# Patient Record
Sex: Female | Born: 1945 | Race: White | Hispanic: No | Marital: Married | Smoking: Former smoker
Health system: Southern US, Community
[De-identification: ages and names within clinical notes are randomized; demographics above are authoritative.]

## PROBLEM LIST (undated history)

## (undated) DIAGNOSIS — F329 Major depressive disorder, single episode, unspecified: Secondary | ICD-10-CM

## (undated) DIAGNOSIS — E785 Hyperlipidemia, unspecified: Secondary | ICD-10-CM

## (undated) DIAGNOSIS — F32A Depression, unspecified: Secondary | ICD-10-CM

## (undated) DIAGNOSIS — I10 Essential (primary) hypertension: Secondary | ICD-10-CM

## (undated) DIAGNOSIS — E079 Disorder of thyroid, unspecified: Secondary | ICD-10-CM

## (undated) DIAGNOSIS — M8580 Other specified disorders of bone density and structure, unspecified site: Secondary | ICD-10-CM

## (undated) DIAGNOSIS — I639 Cerebral infarction, unspecified: Secondary | ICD-10-CM

## (undated) DIAGNOSIS — J45909 Unspecified asthma, uncomplicated: Secondary | ICD-10-CM

## (undated) HISTORY — PX: WRIST SURGERY: SHX841

## (undated) HISTORY — PX: APPENDECTOMY: SHX54

## (undated) HISTORY — DX: Disorder of thyroid, unspecified: E07.9

## (undated) HISTORY — DX: Other specified disorders of bone density and structure, unspecified site: M85.80

## (undated) HISTORY — PX: TUBAL LIGATION: SHX77

## (undated) HISTORY — DX: Hyperlipidemia, unspecified: E78.5

## (undated) HISTORY — DX: Essential (primary) hypertension: I10

## (undated) HISTORY — PX: CHOLECYSTECTOMY: SHX55

## (undated) HISTORY — PX: OTHER SURGICAL HISTORY: SHX169

## (undated) HISTORY — DX: Major depressive disorder, single episode, unspecified: F32.9

## (undated) HISTORY — PX: ABDOMINAL HYSTERECTOMY: SHX81

## (undated) HISTORY — DX: Depression, unspecified: F32.A

---

## 2013-04-01 ENCOUNTER — Encounter: Payer: Self-pay | Admitting: Internal Medicine

## 2013-04-01 ENCOUNTER — Ambulatory Visit (INDEPENDENT_AMBULATORY_CARE_PROVIDER_SITE_OTHER): Payer: BC Managed Care – PPO | Admitting: Internal Medicine

## 2013-04-01 VITALS — BP 142/80 | HR 58 | Temp 97.0°F | Resp 20 | Ht 63.0 in | Wt 256.0 lb

## 2013-04-01 DIAGNOSIS — R609 Edema, unspecified: Secondary | ICD-10-CM

## 2013-04-01 DIAGNOSIS — M899 Disorder of bone, unspecified: Secondary | ICD-10-CM

## 2013-04-01 DIAGNOSIS — F329 Major depressive disorder, single episode, unspecified: Secondary | ICD-10-CM

## 2013-04-01 DIAGNOSIS — I10 Essential (primary) hypertension: Secondary | ICD-10-CM

## 2013-04-01 DIAGNOSIS — Z8669 Personal history of other diseases of the nervous system and sense organs: Secondary | ICD-10-CM

## 2013-04-01 DIAGNOSIS — E039 Hypothyroidism, unspecified: Secondary | ICD-10-CM | POA: Insufficient documentation

## 2013-04-01 DIAGNOSIS — E785 Hyperlipidemia, unspecified: Secondary | ICD-10-CM | POA: Insufficient documentation

## 2013-04-01 DIAGNOSIS — Z780 Asymptomatic menopausal state: Secondary | ICD-10-CM

## 2013-04-01 DIAGNOSIS — IMO0002 Reserved for concepts with insufficient information to code with codable children: Secondary | ICD-10-CM

## 2013-04-01 DIAGNOSIS — Z9079 Acquired absence of other genital organ(s): Secondary | ICD-10-CM | POA: Insufficient documentation

## 2013-04-01 DIAGNOSIS — M8580 Other specified disorders of bone density and structure, unspecified site: Secondary | ICD-10-CM

## 2013-04-01 DIAGNOSIS — R600 Localized edema: Secondary | ICD-10-CM

## 2013-04-01 DIAGNOSIS — Z87898 Personal history of other specified conditions: Secondary | ICD-10-CM

## 2013-04-01 DIAGNOSIS — Z90710 Acquired absence of both cervix and uterus: Secondary | ICD-10-CM

## 2013-04-01 DIAGNOSIS — F32A Depression, unspecified: Secondary | ICD-10-CM | POA: Insufficient documentation

## 2013-04-01 DIAGNOSIS — N951 Menopausal and female climacteric states: Secondary | ICD-10-CM

## 2013-04-01 MED ORDER — LISINOPRIL-HYDROCHLOROTHIAZIDE 20-25 MG PO TABS: 1.0000 | ORAL_TABLET | Freq: Every day | ORAL | Status: DC

## 2013-04-01 NOTE — Progress Notes (Signed)
Subjective:    Patient ID: Veronica Simpson, female    DOB: 08-28-1945, 67 y.o.   MRN: [id number]  HPI [name] is a new pt here for first visit.    PMH Of htn, hyperlipidemia,  Depression(prozac over 10 years), osteopenia,  OSA, hypothyroidism with goiter,  .  She is S/P  HystBSO.  She is concerned over 2 issues.  She has LE edema She tells me that she was started on Norvasc for her BP approx 6 months ago and has noted bilateral leg swelling since then.  No pain in calf  She also has painful intercourse.  Describes pain with penetration.  No vaginal discharge no external rash reported.  She has tried various OTC lubricants but still painful.  She does states she has vaginal dryess    No Known Allergies Past Medical History  Diagnosis Date  . Thyroid disease   . Hyperlipidemia   . Hypertension   . Depression   . Osteopenia    Past Surgical History  Procedure Laterality Date  . Cholecystectomy    . Abdominal hysterectomy    . Wrist surgery Left   . Tubal ligation    . Gstric bypass     History   Social History  . Marital Status: Married    Spouse Name: N/A    Number of Children: N/A  . Years of Education: N/A   Occupational History  . Not on file.   Social History Main Topics  . Smoking status: Former Smoker    Quit date: 04/01/1977  . Smokeless tobacco: Never Used  . Alcohol Use: No  . Drug Use: No  . Sexual Activity: Yes    Birth Control/ Protection: Post-menopausal   Other Topics Concern  . Not on file   Social History Narrative  . No narrative on file   Family History  Problem Relation Age of Onset  . Depression Mother   . Arthritis Mother   . Dementia Mother   . Hyperlipidemia Mother   . Hypertension Mother   . Cancer Father     lymphoma  . Depression Father   . Diabetes Father   . Arthritis Father   . Cancer Sister     stomach  . Cancer Brother     prostate  . Cancer Maternal Grandmother     stomach  . Heart disease Maternal Grandfather    . Cancer Paternal Grandmother     multiple myloma  . Heart disease Paternal Grandfather    Patient Active Problem List   Diagnosis Date Noted  . HTN (hypertension) 04/01/2013  . Hyperlipidemia 04/01/2013  . Hypothyroidism 04/01/2013  . Dyspareunia 04/01/2013  . Depression 04/01/2013  . S/P total hysterectomy and bilateral salpingo-oophorectomy 04/01/2013  . Menopause 04/01/2013  . History of sleep apnea 04/01/2013  . Osteopenia    No current outpatient prescriptions on file prior to visit.   No current facility-administered medications on file prior to visit.       Review of Systems See HPI    Objective:   Physical Exam Physical Exam  Nursing note and vitals reviewed.  Constitutional: She is oriented to person, place, and time. She appears well-developed and well-nourished.  HENT:  Head: Normocephalic and atraumatic.  Cardiovascular: Normal rate and regular rhythm. Exam reveals no gallop and no friction rub.  No murmur heard.  Pulmonary/Chest: Breath sounds normal. She has no wheezes. She has no rales.  Neurological: She is alert and oriented to person, place, and time.  Skin:  Skin is warm and dry.  Ext:  2+ edema bilaterally to both knees Psychiatric: She has a normal mood and affect. Her behavior is normal.          Assessment & Plan:  HTN  Will stop norvasc and change lisinopril to lisinopril/HCTZ 20/25  Check labs today  Edema  norvasc likely contributing  See above low salt diet  Hyperlipidemia  Check today  Dysparuenia  Will do pelvic next visit.  Need mm results before considering vaginal estrogen  She is S/P  Hyst/BSO  Hypthyroidism she reports goiter in past  Continue meds  Check TSH will need ultrasound at some point  Depression continue prozac  OSA  She tell me she is not using her CPAP .    Had seen [facility] in the past

## 2013-04-01 NOTE — Patient Instructions (Addendum)
Stop the lisinopril 40 mg  Take lisinopril /HCTZ  20/25  One daily  Labs today  Go to [email or web address] website  See me 7-10 days

## 2013-04-02 LAB — CBC WITH DIFFERENTIAL/PLATELET
Basophils Absolute: 0.1 10*3/uL (ref 0.0–0.1)
Basophils Relative: 1 % (ref 0–1)
Eosinophils Relative: 4 % (ref 0–5)
HCT: 43.3 % (ref 36.0–46.0)
Lymphocytes Relative: 23 % (ref 12–46)
MCHC: 33.9 g/dL (ref 30.0–36.0)
Monocytes Absolute: 0.8 10*3/uL (ref 0.1–1.0)
Neutro Abs: 5.2 10*3/uL (ref 1.7–7.7)
Platelets: 303 10*3/uL (ref 150–400)
RDW: 13.8 % (ref 11.5–15.5)
WBC: 8.4 10*3/uL (ref 4.0–10.5)

## 2013-04-02 LAB — COMPREHENSIVE METABOLIC PANEL
ALT: 29 U/L (ref 0–35)
AST: 31 U/L (ref 0–37)
Alkaline Phosphatase: 67 U/L (ref 39–117)
BUN: 10 mg/dL (ref 6–23)
Creat: 0.64 mg/dL (ref 0.50–1.10)
Potassium: 3.7 mEq/L (ref 3.5–5.3)

## 2013-04-02 LAB — LIPID PANEL
HDL: 51 mg/dL (ref >39)
LDL Cholesterol: 42 mg/dL (ref 0–99)
Total CHOL/HDL Ratio: 2.3 Ratio

## 2013-04-02 LAB — VITAMIN D 25 HYDROXY (VIT D DEFICIENCY, FRACTURES): Vit D, 25-Hydroxy: 46 ng/mL (ref 30–89)

## 2013-04-02 LAB — TSH: TSH: 0.648 u[IU]/mL (ref 0.350–4.500)

## 2013-04-07 ENCOUNTER — Telehealth: Payer: Self-pay | Admitting: Internal Medicine

## 2013-04-07 NOTE — Telephone Encounter (Signed)
Left message for pt to clal office regarding lab results

## 2013-04-08 ENCOUNTER — Other Ambulatory Visit: Payer: Self-pay | Admitting: *Deleted

## 2013-04-08 MED ORDER — FLUOXETINE HCL 40 MG PO CAPS: 40.0000 mg | ORAL_CAPSULE | Freq: Every day | ORAL | Status: DC

## 2013-04-08 NOTE — Telephone Encounter (Signed)
Pt request 90 days also  Returned your call from 04/07/13

## 2013-04-12 ENCOUNTER — Encounter: Payer: Self-pay | Admitting: Internal Medicine

## 2013-04-12 ENCOUNTER — Ambulatory Visit (INDEPENDENT_AMBULATORY_CARE_PROVIDER_SITE_OTHER): Payer: BC Managed Care – PPO | Admitting: Internal Medicine

## 2013-04-12 ENCOUNTER — Encounter: Payer: Self-pay | Admitting: *Deleted

## 2013-04-12 ENCOUNTER — Telehealth: Payer: Self-pay | Admitting: *Deleted

## 2013-04-12 VITALS — BP 138/70 | HR 51 | Temp 97.5°F | Resp 18 | Wt 254.0 lb

## 2013-04-12 DIAGNOSIS — N9489 Other specified conditions associated with female genital organs and menstrual cycle: Secondary | ICD-10-CM

## 2013-04-12 DIAGNOSIS — I10 Essential (primary) hypertension: Secondary | ICD-10-CM

## 2013-04-12 DIAGNOSIS — R3915 Urgency of urination: Secondary | ICD-10-CM

## 2013-04-12 DIAGNOSIS — N898 Other specified noninflammatory disorders of vagina: Secondary | ICD-10-CM | POA: Insufficient documentation

## 2013-04-12 MED ORDER — ESTRADIOL 10 MCG VA TABS: ORAL_TABLET | VAGINAL | Status: DC

## 2013-04-12 NOTE — Addendum Note (Signed)
Addended by: Raechel Chute D on: 04/12/2013 12:08 PM   Modules accepted: Orders

## 2013-04-12 NOTE — Progress Notes (Signed)
Subjective:    Patient ID: Veronica Simpson, female    DOB: [birth year], 67 y.o.   MRN: [id number]  HPI Veronica Simpson is here for follow up of elevated bilirubin.  She reports she has a long history of elevarted bilirubin that has been evalutated by Dr. Loman Chroman.  She has had a past liver biopsy and was told it was "fine"  She has never been jaundiced.  Social ETOH  She is off her norvasc now.   No LE edema.  BP at home is in the "130's"   She has lost 2 lbs on her scale at home.    She continues to have vaginal dryness -  Her mm result is pending.    She does have urinary urgency  No dysuria  No Known Allergies Past Medical History  Diagnosis Date  . Thyroid disease   . Hyperlipidemia   . Hypertension   . Depression   . Osteopenia    Past Surgical History  Procedure Laterality Date  . Cholecystectomy    . Abdominal hysterectomy    . Wrist surgery Left   . Tubal ligation    . Gstric bypass     History   Social History  . Marital Status: Married    Spouse Name: N/A    Number of Children: N/A  . Years of Education: N/A   Occupational History  . Not on file.   Social History Main Topics  . Smoking status: Former Smoker    Quit date: 04/01/1977  . Smokeless tobacco: Never Used  . Alcohol Use: No  . Drug Use: No  . Sexual Activity: Yes    Birth Control/ Protection: Post-menopausal   Other Topics Concern  . Not on file   Social History Narrative  . No narrative on file   Family History  Problem Relation Age of Onset  . Depression Mother   . Arthritis Mother   . Dementia Mother   . Hyperlipidemia Mother   . Hypertension Mother   . Cancer Father     lymphoma  . Depression Father   . Diabetes Father   . Arthritis Father   . Cancer Sister     stomach  . Cancer Brother     prostate  . Cancer Maternal Grandmother     stomach  . Heart disease Maternal Grandfather   . Cancer Paternal Grandmother     multiple myloma  . Heart disease Paternal Grandfather     Patient Active Problem List   Diagnosis Date Noted  . Urinary urgency 04/12/2013  . Vaginal dryness 04/12/2013  . HTN (hypertension) 04/01/2013  . Hyperlipidemia 04/01/2013  . Hypothyroidism 04/01/2013  . Dyspareunia 04/01/2013  . Depression 04/01/2013  . S/P total hysterectomy and bilateral salpingo-oophorectomy 04/01/2013  . Menopause 04/01/2013  . History of sleep apnea 04/01/2013  . Edema, lower extremity 04/01/2013  . Osteopenia    Current Outpatient Prescriptions on File Prior to Visit  Medication Sig Dispense Refill  . aspirin 325 MG tablet Take 325 mg by mouth daily.      Marland Kitchen atenolol (TENORMIN) 50 MG tablet Take 50 mg by mouth daily.      Marland Kitchen atorvastatin (LIPITOR) 40 MG tablet Take 40 mg by mouth daily.      . Cholecalciferol (VITAMIN D-3) 1000 UNITS CAPS Take 1 capsule by mouth daily.      Marland Kitchen co-enzyme Q-10 30 MG capsule Take 30 mg by mouth daily.      Marland Kitchen FLUoxetine (PROZAC) 40 MG capsule  Take 1 capsule (40 mg total) by mouth daily.  90 capsule  1  . ibandronate (BONIVA) 150 MG tablet Take 150 mg by mouth every 30 (thirty) days. Take in the morning with a full glass of water, on an empty stomach, and do not take anything else by mouth or lie down for the next 30 min.      Marland Kitchen levothyroxine (SYNTHROID, LEVOTHROID) 88 MCG tablet Take 88 mcg by mouth daily before breakfast.      . lisinopril-hydrochlorothiazide (PRINZIDE,ZESTORETIC) 20-25 MG per tablet Take 1 tablet by mouth daily.  90 tablet  3  . Misc Natural Products (OSTEO BI-FLEX ADV TRIPLE ST PO) Take 2 capsules by mouth daily.      . Multiple Vitamins-Minerals (MULTIVITAMIN WITH MINERALS) tablet Take 1 tablet by mouth daily.      Marland Kitchen amLODipine (NORVASC) 5 MG tablet Take 5 mg by mouth daily.      . vitamin C (ASCORBIC ACID) 500 MG tablet Take 500 mg by mouth daily.       No current facility-administered medications on file prior to visit.       Review of Systems See HPI    Objective:   Physical Exam Physical Exam   Nursing note and vitals reviewed.  Constitutional: She is oriented to person, place, and time. She appears well-developed and well-nourished.  HENT:  Head: Normocephalic and atraumatic.  Cardiovascular: Normal rate and regular rhythm. Exam reveals no gallop and no friction rub.  No murmur heard.  Pulmonary/Chest: Breath sounds normal. She has no wheezes. She has no rales.  Abd  No CVA tenderness Neurological: She is alert and oriented to person, place, and time.  Skin: Skin is warm and dry.  Psychiatric: She has a normal mood and affect. Her behavior is normal.             Assessment & Plan:  HTN  Off norvasc now  BP in acceptable range  Le Edema  Resolved off norvasc  Urinary urgency    U/A today neg  Vaginal dryness/dyspareunia  Once I know mm normal can start vagifem  Nightly for 2 wweeks then 2 times per week   Schedule for pelvic exam

## 2013-04-12 NOTE — Telephone Encounter (Signed)
Notified pt that she can begin taking the vagifem

## 2013-04-12 NOTE — Telephone Encounter (Signed)
Message copied by Mathews Robinsons on Mon Apr 12, 2013  8:55 AM ------      Message from: Raechel Chute D      Created: Mon Apr 12, 2013  8:21 AM       Karen Kitchens            Call pt and let her know that her bilirubin is high.  I need to see her in office about this.  Give her an appt             Route back with the date ------

## 2013-04-12 NOTE — Patient Instructions (Addendum)
Use vagifem  10 mcg nightly for 2 weeks then cut back to twice a week  Can use Luvena with intercouse  See me for pelvic exam

## 2013-04-12 NOTE — Telephone Encounter (Signed)
Left message fofr pt to return call

## 2013-04-15 ENCOUNTER — Telehealth: Payer: Self-pay | Admitting: *Deleted

## 2013-04-20 NOTE — Telephone Encounter (Signed)
error 

## 2013-04-26 ENCOUNTER — Ambulatory Visit (INDEPENDENT_AMBULATORY_CARE_PROVIDER_SITE_OTHER): Payer: BC Managed Care – PPO | Admitting: Internal Medicine

## 2013-04-26 ENCOUNTER — Encounter: Payer: Self-pay | Admitting: Internal Medicine

## 2013-04-26 VITALS — BP 144/78 | HR 54 | Temp 97.2°F | Resp 18 | Wt 257.0 lb

## 2013-04-26 DIAGNOSIS — N898 Other specified noninflammatory disorders of vagina: Secondary | ICD-10-CM

## 2013-04-26 DIAGNOSIS — R002 Palpitations: Secondary | ICD-10-CM

## 2013-04-26 DIAGNOSIS — I498 Other specified cardiac arrhythmias: Secondary | ICD-10-CM

## 2013-04-26 DIAGNOSIS — IMO0002 Reserved for concepts with insufficient information to code with codable children: Secondary | ICD-10-CM

## 2013-04-26 DIAGNOSIS — Z1151 Encounter for screening for human papillomavirus (HPV): Secondary | ICD-10-CM

## 2013-04-26 DIAGNOSIS — Z1272 Encounter for screening for malignant neoplasm of vagina: Secondary | ICD-10-CM

## 2013-04-26 DIAGNOSIS — N9489 Other specified conditions associated with female genital organs and menstrual cycle: Secondary | ICD-10-CM

## 2013-04-26 MED ORDER — ESTROGENS, CONJUGATED 0.625 MG/GM VA CREA: TOPICAL_CREAM | VAGINAL | Status: DC

## 2013-04-26 NOTE — Progress Notes (Signed)
Subjective:    Patient ID: Veronica Simpson, female    DOB: 1945/12/03, 67 y.o.   MRN: [id number]  HPI Veronica Simpson  Is here for pelvic.  She is S/P hysterectomy and BSO  She report she could not affored the vagifem as she had a $200.00 co-pay  She was feeling anxious as if her heart was beating rapidly  No chest pain no radiation.  She has had similar episodes in the past  No Known Allergies Past Medical History  Diagnosis Date  . Thyroid disease   . Hyperlipidemia   . Hypertension   . Depression   . Osteopenia    Past Surgical History  Procedure Laterality Date  . Cholecystectomy    . Abdominal hysterectomy    . Wrist surgery Left   . Tubal ligation    . Gstric bypass     History   Social History  . Marital Status: Married    Spouse Name: N/A    Number of Children: N/A  . Years of Education: N/A   Occupational History  . Not on file.   Social History Main Topics  . Smoking status: Former Smoker    Quit date: 04/01/1977  . Smokeless tobacco: Never Used  . Alcohol Use: No  . Drug Use: No  . Sexual Activity: Yes    Birth Control/ Protection: Post-menopausal   Other Topics Concern  . Not on file   Social History Narrative  . No narrative on file   Family History  Problem Relation Age of Onset  . Depression Mother   . Arthritis Mother   . Dementia Mother   . Hyperlipidemia Mother   . Hypertension Mother   . Cancer Father     lymphoma  . Depression Father   . Diabetes Father   . Arthritis Father   . Cancer Sister     stomach  . Cancer Brother     prostate  . Cancer Maternal Grandmother     stomach  . Heart disease Maternal Grandfather   . Cancer Paternal Grandmother     multiple myloma  . Heart disease Paternal Grandfather    Patient Active Problem List   Diagnosis Date Noted  . Urinary urgency 04/12/2013  . Vaginal dryness 04/12/2013  . HTN (hypertension) 04/01/2013  . Hyperlipidemia 04/01/2013  . Hypothyroidism 04/01/2013  . Dyspareunia  04/01/2013  . Depression 04/01/2013  . S/P total hysterectomy and bilateral salpingo-oophorectomy 04/01/2013  . Menopause 04/01/2013  . History of sleep apnea 04/01/2013  . Edema, lower extremity 04/01/2013  . Osteopenia    Current Outpatient Prescriptions on File Prior to Visit  Medication Sig Dispense Refill  . amLODipine (NORVASC) 5 MG tablet Take 5 mg by mouth daily.      Marland Kitchen aspirin 325 MG tablet Take 325 mg by mouth daily.      Marland Kitchen atenolol (TENORMIN) 50 MG tablet Take 50 mg by mouth daily.      Marland Kitchen atorvastatin (LIPITOR) 40 MG tablet Take 40 mg by mouth daily.      . Cholecalciferol (VITAMIN D-3) 1000 UNITS CAPS Take 1 capsule by mouth daily.      Marland Kitchen co-enzyme Q-10 30 MG capsule Take 30 mg by mouth daily.      Marland Kitchen FLUoxetine (PROZAC) 40 MG capsule Take 1 capsule (40 mg total) by mouth daily.  90 capsule  1  . ibandronate (BONIVA) 150 MG tablet Take 150 mg by mouth every 30 (thirty) days. Take in the morning with a full  glass of water, on an empty stomach, and do not take anything else by mouth or lie down for the next 30 min.      Marland Kitchen levothyroxine (SYNTHROID, LEVOTHROID) 88 MCG tablet Take 88 mcg by mouth daily before breakfast.      . lisinopril-hydrochlorothiazide (PRINZIDE,ZESTORETIC) 20-25 MG per tablet Take 1 tablet by mouth daily.  90 tablet  3  . Misc Natural Products (OSTEO BI-FLEX ADV TRIPLE ST PO) Take 2 capsules by mouth daily.      . Multiple Vitamins-Minerals (MULTIVITAMIN WITH MINERALS) tablet Take 1 tablet by mouth daily.      . vitamin C (ASCORBIC ACID) 500 MG tablet Take 500 mg by mouth daily.       No current facility-administered medications on file prior to visit.       Review of Systems    see HPI  Objective:   Physical Exam Physical Exam  Nursing note and vitals reviewed.  Constitutional: She is oriented to person, place, and time. She appears well-developed and well-nourished.  HENT:  Head: Normocephalic and atraumatic.  Cardiovascular: Normal rate and  regular rhythm. Exam reveals no gallop and no friction rub.  No murmur heard.  Pulmonary/Chest: Breath sounds normal. She has no wheezes. She has no rales.  Neurological: She is alert and oriented to person, place, and time.  Pelvic  Thin pale labia,  Fusion of labia minora Vagina no lesion Pap obtained vaginal cuff No adnexal masses or tenderness Skin: Skin is warm and dry.  Psychiatric: She has a normal mood and affect. Her behavior is normal.             Assessment & Plan:  Dyspareunia/vaginal dryness  Will give Premarin 0.5 gm nightly two weeks then twice a week  Palpitations  EKG shows sinus bradycardia  No acute changes Likely du to anxiety  Schedule CPE with me

## 2013-04-26 NOTE — Addendum Note (Signed)
Addended by: Mathews Robinsons on: 04/26/2013 01:21 PM   Modules accepted: Orders

## 2013-04-26 NOTE — Patient Instructions (Addendum)
Schedule CPE with me    Call if needed

## 2013-04-29 ENCOUNTER — Encounter: Payer: Self-pay | Admitting: *Deleted

## 2013-05-09 ENCOUNTER — Encounter: Payer: Self-pay | Admitting: Internal Medicine

## 2013-05-09 DIAGNOSIS — M810 Age-related osteoporosis without current pathological fracture: Secondary | ICD-10-CM | POA: Insufficient documentation

## 2013-05-09 DIAGNOSIS — Z8673 Personal history of transient ischemic attack (TIA), and cerebral infarction without residual deficits: Secondary | ICD-10-CM | POA: Insufficient documentation

## 2013-06-03 ENCOUNTER — Other Ambulatory Visit: Payer: Self-pay

## 2013-06-14 ENCOUNTER — Telehealth: Payer: Self-pay | Admitting: *Deleted

## 2013-06-14 ENCOUNTER — Other Ambulatory Visit: Payer: Self-pay | Admitting: *Deleted

## 2013-06-14 NOTE — Telephone Encounter (Signed)
Refill request

## 2013-06-14 NOTE — Telephone Encounter (Signed)
Veronica Simpson needs a new Rx of atorvastatin (LIPITOR) 40 MG tablet called into the pharmacy.

## 2013-06-15 MED ORDER — ATORVASTATIN CALCIUM 40 MG PO TABS: 40.0000 mg | ORAL_TABLET | Freq: Every day | ORAL | Status: DC

## 2013-07-16 ENCOUNTER — Other Ambulatory Visit: Payer: Self-pay | Admitting: *Deleted

## 2013-07-16 ENCOUNTER — Encounter: Payer: Self-pay | Admitting: Internal Medicine

## 2013-07-16 NOTE — Telephone Encounter (Signed)
Refill request CPE in Feb.

## 2013-07-17 MED ORDER — ATENOLOL 50 MG PO TABS: 50.0000 mg | ORAL_TABLET | Freq: Every day | ORAL | Status: DC

## 2013-07-26 ENCOUNTER — Encounter: Payer: Self-pay | Admitting: Internal Medicine

## 2013-07-26 ENCOUNTER — Other Ambulatory Visit: Payer: Self-pay | Admitting: *Deleted

## 2013-07-26 MED ORDER — AMLODIPINE BESYLATE 5 MG PO TABS: 5.0000 mg | ORAL_TABLET | Freq: Every day | ORAL | Status: DC

## 2013-07-26 NOTE — Telephone Encounter (Signed)
Refill request

## 2013-09-08 ENCOUNTER — Encounter: Payer: Self-pay | Admitting: Internal Medicine

## 2013-09-08 ENCOUNTER — Ambulatory Visit (INDEPENDENT_AMBULATORY_CARE_PROVIDER_SITE_OTHER): Payer: BC Managed Care – PPO | Admitting: Internal Medicine

## 2013-09-08 VITALS — BP 145/67 | HR 59 | Temp 98.1°F | Resp 18 | Wt 265.0 lb

## 2013-09-08 DIAGNOSIS — E785 Hyperlipidemia, unspecified: Secondary | ICD-10-CM

## 2013-09-08 DIAGNOSIS — I498 Other specified cardiac arrhythmias: Secondary | ICD-10-CM

## 2013-09-08 DIAGNOSIS — Z9884 Bariatric surgery status: Secondary | ICD-10-CM

## 2013-09-08 DIAGNOSIS — N952 Postmenopausal atrophic vaginitis: Secondary | ICD-10-CM

## 2013-09-08 DIAGNOSIS — R001 Bradycardia, unspecified: Secondary | ICD-10-CM

## 2013-09-08 DIAGNOSIS — E039 Hypothyroidism, unspecified: Secondary | ICD-10-CM

## 2013-09-08 DIAGNOSIS — I10 Essential (primary) hypertension: Secondary | ICD-10-CM

## 2013-09-08 DIAGNOSIS — Z23 Encounter for immunization: Secondary | ICD-10-CM

## 2013-09-08 DIAGNOSIS — Z0000 Encounter for general adult medical examination without abnormal findings: Secondary | ICD-10-CM

## 2013-09-08 LAB — POCT URINALYSIS DIPSTICK
Bilirubin, UA: NEGATIVE
Blood, UA: NEGATIVE
Glucose, UA: NEGATIVE
Ketones, UA: NEGATIVE
Leukocytes, UA: NEGATIVE
NITRITE UA: NEGATIVE
PROTEIN UA: NEGATIVE
SPEC GRAV UA: 1.02
UROBILINOGEN UA: NEGATIVE
pH, UA: 6

## 2013-09-08 LAB — HEMOCCULT GUIAC POC 1CARD (OFFICE): FECAL OCCULT BLD: NEGATIVE

## 2013-09-08 NOTE — Progress Notes (Signed)
Subjective:    Patient ID: Veronica Simpson, female    DOB: [birth year], 68 y.o.   MRN: [id number]  HPI  Veronica Simpson is here for CPE  She could not afford estrogen creme for her atrophic vaginitis.  She is OK with this  Tolerating meds fine.    Mother with advancing dementia and this will stress patient  No Known Allergies Past Medical History  Diagnosis Date  . Thyroid disease   . Hyperlipidemia   . Hypertension   . Depression   . Osteopenia    Past Surgical History  Procedure Laterality Date  . Cholecystectomy    . Abdominal hysterectomy    . Wrist surgery Left   . Tubal ligation    . Gstric bypass     History   Social History  . Marital Status: Married    Spouse Name: N/A    Number of Children: N/A  . Years of Education: N/A   Occupational History  . Not on file.   Social History Main Topics  . Smoking status: Former Smoker    Quit date: 04/01/1977  . Smokeless tobacco: Never Used  . Alcohol Use: No  . Drug Use: No  . Sexual Activity: Yes    Birth Control/ Protection: Post-menopausal   Other Topics Concern  . Not on file   Social History Narrative  . No narrative on file   Family History  Problem Relation Age of Onset  . Depression Mother   . Arthritis Mother   . Dementia Mother   . Hyperlipidemia Mother   . Hypertension Mother   . Cancer Father     lymphoma  . Depression Father   . Diabetes Father   . Arthritis Father   . Cancer Sister     stomach  . Cancer Brother     prostate  . Cancer Maternal Grandmother     stomach  . Heart disease Maternal Grandfather   . Cancer Paternal Grandmother     multiple myloma  . Heart disease Paternal Grandfather    Patient Active Problem List   Diagnosis Date Noted  . Sinus bradycardia by electrocardiogram 09/08/2013  . Atrophic vaginitis 09/08/2013  . History of gastric bypass 05/09/2013  . 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 Osteoporosis 05/09/2013  . History of TIA (transient ischemic attack)  05/09/2013  . Urinary urgency 04/12/2013  . Vaginal dryness 04/12/2013  . HTN (hypertension) 04/01/2013  . Hyperlipidemia 04/01/2013  . Hypothyroidism 04/01/2013  . Dyspareunia 04/01/2013  . Depression 04/01/2013  . S/P total hysterectomy and bilateral salpingo-oophorectomy 04/01/2013  . Menopause 04/01/2013  . History of sleep apnea 04/01/2013  . Edema, lower extremity 04/01/2013  . Osteopenia    Current Outpatient Prescriptions on File Prior to Visit  Medication Sig Dispense Refill  . amLODipine (NORVASC) 5 MG tablet Take 1 tablet (5 mg total) by mouth daily.  90 tablet  1  . aspirin 325 MG tablet Take 325 mg by mouth daily.      Marland Kitchen atenolol (TENORMIN) 50 MG tablet Take 1 tablet (50 mg total) by mouth daily.  90 tablet  2  . atorvastatin (LIPITOR) 40 MG tablet Take 1 tablet (40 mg total) by mouth daily.  90 tablet  0  . Cholecalciferol (VITAMIN D-3) 1000 UNITS CAPS Take 1 capsule by mouth daily.      Marland Kitchen co-enzyme Q-10 30 MG capsule Take 30 mg by mouth daily.      Marland Kitchen FLUoxetine (PROZAC) 40 MG capsule Take 1 capsule (  40 mg total) by mouth daily.  90 capsule  1  . ibandronate (BONIVA) 150 MG tablet Take 150 mg by mouth every 30 (thirty) days. Take in the morning with a full glass of water, on an empty stomach, and do not take anything else by mouth or lie down for the next 30 min.      Marland Kitchen levothyroxine (SYNTHROID, LEVOTHROID) 88 MCG tablet Take 88 mcg by mouth daily before breakfast.      . lisinopril-hydrochlorothiazide (PRINZIDE,ZESTORETIC) 20-25 MG per tablet Take 1 tablet by mouth daily.  90 tablet  3  . Misc Natural Products (OSTEO BI-FLEX ADV TRIPLE ST PO) Take 2 capsules by mouth daily.      . Multiple Vitamins-Minerals (MULTIVITAMIN WITH MINERALS) tablet Take 1 tablet by mouth daily.      . vitamin C (ASCORBIC ACID) 500 MG tablet Take 500 mg by mouth daily.       No current facility-administered medications on file prior to visit.      Review of Systems See HPI    Objective:     Physical Exam  Physical Exam  Nursing note and vitals reviewed.  Constitutional: She is oriented to person, place, and time. She appears well-developed and well-nourished.  HENT:  Head: Normocephalic and atraumatic.  Right Ear: Tympanic membrane and ear canal normal. No drainage. Tympanic membrane is not injected and not erythematous.  Left Ear: Tympanic membrane and ear canal normal. No drainage. Tympanic membrane is not injected and not erythematous.  Nose: Nose normal. Right sinus exhibits no maxillary sinus tenderness and no frontal sinus tenderness. Left sinus exhibits no maxillary sinus tenderness and no frontal sinus tenderness.  Mouth/Throat: Oropharynx is clear and moist. No oral lesions. No oropharyngeal exudate.  Eyes: Conjunctivae and EOM are normal. Pupils are equal, round, and reactive to light.  Neck: Normal range of motion. Neck supple. No JVD present. Carotid bruit is not present. No mass and no thyromegaly present.  Cardiovascular: Normal rate, regular rhythm, S1 normal, S2 normal and intact distal pulses. Exam reveals no gallop and no friction rub.  No murmur heard.  Pulses:  Carotid pulses are 2+ on the right side, and 2+ on the left side.  Dorsalis pedis pulses are 2+ on the right side, and 2+ on the left side.  No carotid bruit. No LE edema  Pulmonary/Chest: Breath sounds normal. She has no wheezes. She has no rales. She exhibits no tenderness.  Breasts no discrete mass no nipple discharge no axillary adenopathy   Abdominal: Soft. Bowel sounds are normal. She exhibits no distension and no mass. There is no hepatosplenomegaly. There is no tenderness. There is no CVA tenderness.  Rectal guaiac neg. No mass Musculoskeletal: Normal range of motion.  No active synovitis to joints.  Lymphadenopathy:  She has no cervical adenopathy.  She has no axillary adenopathy.  Right: No inguinal and no supraclavicular adenopathy present.  Left: No inguinal and no supraclavicular  adenopathy present.  Neurological: She is alert and oriented to person, place, and time. She has normal strength and normal reflexes. She displays no tremor. No cranial nerve deficit or sensory deficit. Coordination and gait normal.  Skin: Skin is warm and dry. No rash noted. No cyanosis. Nails show no clubbing.  Psychiatric: She has a normal mood and affect. Her speech is normal and behavior is normal. Cognition and memory are normal.       Assessment & Plan:  Health Maintenance:  Pneumovax today.  Guaiac neg.  UTD with mm.  Colonoscopy due in 2019.  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

## 2013-09-08 NOTE — Patient Instructions (Signed)
See me in 6 months

## 2013-09-09 ENCOUNTER — Other Ambulatory Visit: Payer: Self-pay | Admitting: Internal Medicine

## 2013-09-10 ENCOUNTER — Encounter: Payer: Self-pay | Admitting: Internal Medicine

## 2013-09-27 ENCOUNTER — Other Ambulatory Visit: Payer: Self-pay | Admitting: *Deleted

## 2013-09-27 MED ORDER — FLUOXETINE HCL 40 MG PO CAPS: 40.0000 mg | ORAL_CAPSULE | Freq: Every day | ORAL | Status: DC

## 2013-09-27 NOTE — Telephone Encounter (Signed)
Refill request

## 2013-09-29 ENCOUNTER — Encounter: Payer: Self-pay | Admitting: Internal Medicine

## 2013-10-04 ENCOUNTER — Other Ambulatory Visit: Payer: Self-pay | Admitting: *Deleted

## 2013-10-04 MED ORDER — FLUOXETINE HCL 40 MG PO CAPS: 40.0000 mg | ORAL_CAPSULE | Freq: Every day | ORAL | Status: DC

## 2013-10-04 NOTE — Telephone Encounter (Signed)
Refill request

## 2013-10-06 ENCOUNTER — Encounter: Payer: Self-pay | Admitting: Internal Medicine

## 2013-10-20 ENCOUNTER — Telehealth: Payer: Self-pay | Admitting: *Deleted

## 2013-10-20 NOTE — Telephone Encounter (Signed)
Veronica Simpson needs refill on ibandronate (BONIVA)  [facility]

## 2013-10-21 ENCOUNTER — Other Ambulatory Visit: Payer: Self-pay | Admitting: *Deleted

## 2013-10-21 NOTE — Telephone Encounter (Signed)
Refill request

## 2013-10-22 ENCOUNTER — Other Ambulatory Visit: Payer: Self-pay | Admitting: *Deleted

## 2013-10-22 NOTE — Telephone Encounter (Signed)
Refill request

## 2013-10-23 MED ORDER — IBANDRONATE SODIUM 150 MG PO TABS: 150.0000 mg | ORAL_TABLET | ORAL | Status: DC

## 2013-10-25 ENCOUNTER — Telehealth: Payer: Self-pay | Admitting: *Deleted

## 2013-10-25 NOTE — Telephone Encounter (Signed)
Boniva called in to [facility]

## 2013-11-04 ENCOUNTER — Other Ambulatory Visit: Payer: Self-pay | Admitting: *Deleted

## 2013-11-04 NOTE — Telephone Encounter (Signed)
Refill request

## 2013-11-05 MED ORDER — LEVOTHYROXINE SODIUM 88 MCG PO TABS: 88.0000 ug | ORAL_TABLET | Freq: Every day | ORAL | Status: DC

## 2013-12-01 ENCOUNTER — Encounter: Payer: Self-pay | Admitting: Internal Medicine

## 2013-12-01 ENCOUNTER — Ambulatory Visit (INDEPENDENT_AMBULATORY_CARE_PROVIDER_SITE_OTHER): Payer: BC Managed Care – PPO | Admitting: Internal Medicine

## 2013-12-01 VITALS — BP 115/63 | HR 53 | Temp 98.5°F | Resp 18 | Wt 249.0 lb

## 2013-12-01 DIAGNOSIS — S239XXA Sprain of unspecified parts of thorax, initial encounter: Secondary | ICD-10-CM

## 2013-12-01 DIAGNOSIS — M549 Dorsalgia, unspecified: Secondary | ICD-10-CM

## 2013-12-01 DIAGNOSIS — S29012A Strain of muscle and tendon of back wall of thorax, initial encounter: Secondary | ICD-10-CM

## 2013-12-01 LAB — POCT URINALYSIS DIPSTICK
BILIRUBIN UA: NEGATIVE
Blood, UA: NEGATIVE
GLUCOSE UA: NEGATIVE
KETONES UA: NEGATIVE
LEUKOCYTES UA: NEGATIVE
NITRITE UA: NEGATIVE
Protein, UA: NEGATIVE
Spec Grav, UA: 1.015
Urobilinogen, UA: NEGATIVE
pH, UA: 5.5

## 2013-12-01 MED ORDER — CYCLOBENZAPRINE HCL 10 MG PO TABS: ORAL_TABLET | ORAL | Status: DC

## 2013-12-01 MED ORDER — IBUPROFEN 800 MG PO TABS: ORAL_TABLET | ORAL | Status: AC

## 2013-12-01 NOTE — Progress Notes (Signed)
Subjective:    Patient ID: Veronica Simpson, female    DOB: 1945-12-30, 68 y.o.   MRN: [id number]  HPI Veronica Simpson is here for acute visit  3 weeks ago had R sided back pain especially when bendig over.  No dysuria or urgency.  Does not recall injury or trauma.    No paresthesias no muscle weakness  No Known Allergies Past Medical History  Diagnosis Date  . Thyroid disease   . Hyperlipidemia   . Hypertension   . Depression   . Osteopenia    Past Surgical History  Procedure Laterality Date  . Cholecystectomy    . Abdominal hysterectomy    . Wrist surgery Left   . Tubal ligation    . Gstric bypass     History   Social History  . Marital Status: Married    Spouse Name: N/A    Number of Children: N/A  . Years of Education: N/A   Occupational History  . Not on file.   Social History Main Topics  . Smoking status: Former Smoker    Quit date: 04/01/1977  . Smokeless tobacco: Never Used  . Alcohol Use: No  . Drug Use: No  . Sexual Activity: Yes    Birth Control/ Protection: Post-menopausal   Other Topics Concern  . Not on file   Social History Narrative  . No narrative on file   Family History  Problem Relation Age of Onset  . Depression Mother   . Arthritis Mother   . Dementia Mother   . Hyperlipidemia Mother   . Hypertension Mother   . Cancer Father     lymphoma  . Depression Father   . Diabetes Father   . Arthritis Father   . Cancer Sister     stomach  . Cancer Brother     prostate  . Cancer Maternal Grandmother     stomach  . Heart disease Maternal Grandfather   . Cancer Paternal Grandmother     multiple myloma  . Heart disease Paternal Grandfather    Patient Active Problem List   Diagnosis Date Noted  . Sinus bradycardia by electrocardiogram 09/08/2013  . Atrophic vaginitis 09/08/2013  . History of gastric stapling 09/08/2013  . 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 Osteoporosis 05/09/2013  . History of TIA (transient ischemic attack)  05/09/2013  . Urinary urgency 04/12/2013  . Vaginal dryness 04/12/2013  . HTN (hypertension) 04/01/2013  . Hyperlipidemia 04/01/2013  . Hypothyroidism 04/01/2013  . Dyspareunia 04/01/2013  . Depression 04/01/2013  . S/P total hysterectomy and bilateral salpingo-oophorectomy 04/01/2013  . Menopause 04/01/2013  . History of sleep apnea 04/01/2013  . Edema, lower extremity 04/01/2013  . Osteopenia    Current Outpatient Prescriptions on File Prior to Visit  Medication Sig Dispense Refill  . amLODipine (NORVASC) 5 MG tablet Take 1 tablet (5 mg total) by mouth daily.  90 tablet  1  . aspirin 325 MG tablet Take 325 mg by mouth daily.      Marland Kitchen atenolol (TENORMIN) 50 MG tablet Take 1 tablet (50 mg total) by mouth daily.  90 tablet  2  . Cholecalciferol (VITAMIN D-3) 1000 UNITS CAPS Take 1 capsule by mouth daily.      Marland Kitchen co-enzyme Q-10 30 MG capsule Take 30 mg by mouth daily.      Marland Kitchen FLUoxetine (PROZAC) 40 MG capsule Take 1 capsule (40 mg total) by mouth daily.  90 capsule  1  . ibandronate (BONIVA) 150 MG tablet Take 1 tablet (  150 mg total) by mouth every 30 (thirty) days. Take in the morning with a full glass of water, on an empty stomach, and do not take anything else by mouth or lie down for the next 30 min.  3 tablet  2  . levothyroxine (SYNTHROID, LEVOTHROID) 88 MCG tablet Take 1 tablet (88 mcg total) by mouth daily before breakfast.  90 tablet  1  . lisinopril-hydrochlorothiazide (PRINZIDE,ZESTORETIC) 20-25 MG per tablet Take 1 tablet by mouth daily.  90 tablet  3  . Misc Natural Products (OSTEO BI-FLEX ADV TRIPLE ST PO) Take 2 capsules by mouth daily.      . Multiple Vitamins-Minerals (MULTIVITAMIN WITH MINERALS) tablet Take 1 tablet by mouth daily.      . vitamin C (ASCORBIC ACID) 500 MG tablet Take 500 mg by mouth daily.      Marland Kitchen atorvastatin (LIPITOR) 40 MG tablet TAKE 1 TABLET (40 MG TOTAL) BY MOUTH DAILY.  90 tablet  1   No current facility-administered medications on file prior to visit.        Review of Systems See HPI    Objective:   Physical Exam  Physical Exam  Nursing note and vitals reviewed.  Constitutional: She is oriented to person, place, and time. She appears well-developed and well-nourished.  HENT:  Head: Normocephalic and atraumatic.  Cardiovascular: Normal rate and regular rhythm. Exam reveals no gallop and no friction rub.  No murmur heard.  Pulmonary/Chest: Breath sounds normal. She has no wheezes. She has no rales.  Back  No CVA  Tenderness  Paraspinal muscle stiffness right side of thoracic muscles Neurological: She is alert and oriented to person, place, and time.  SLR  Neg bilaterally Rflexes 2+ symmetric Motor  5/5 Ue and LE Skin: Skin is warm and dry.  Psychiatric: She has a normal mood and affect. Her behavior is normal.       Assessment & Plan:   R sided thoracic strain  Will give Ibuprofen  800 mg tid for next 10 days.  Flexeril whole tablet at night and /1/2 in daytime  U/A today normal

## 2013-12-01 NOTE — Patient Instructions (Signed)
See me if needed

## 2013-12-01 NOTE — Addendum Note (Signed)
Addended by: Conley Rolls on: 12/01/2013 02:19 PM   Modules accepted: Orders

## 2013-12-15 ENCOUNTER — Ambulatory Visit: Payer: BC Managed Care – PPO | Admitting: Internal Medicine

## 2013-12-17 ENCOUNTER — Encounter: Payer: Self-pay | Admitting: *Deleted

## 2014-01-08 ENCOUNTER — Other Ambulatory Visit: Payer: Self-pay | Admitting: Internal Medicine

## 2014-01-10 NOTE — Telephone Encounter (Signed)
Please see Refill Request below  Requested Medications     Medication name:  Name from pharmacy:  atenolol (TENORMIN) 50 MG tablet  ATENOLOL 50MG  TAB    Sig: TAKE ONE TABLET BY MOUTH DAILY    Dispense: 90 tablet Refills: 1 Start: 01/08/2014  Class: Normal    Notes to pharmacy: [facility]. Authorization is required for next refill.    Requested on: 07/17/2013    Originally ordered on: 04/01/2013 Last refill: 01/08/2014 Order History and Details

## 2014-01-19 ENCOUNTER — Other Ambulatory Visit: Payer: Self-pay | Admitting: Internal Medicine

## 2014-01-19 NOTE — Telephone Encounter (Signed)
Requested Medications     Medication name:  Name from pharmacy:  amLODipine (NORVASC) 5 MG tablet  amLODIPine BESYLATE 5MG  TAB    Sig: TAKE 1 TABLET (5 MG TOTAL) BY MOUTH DAILY.    Dispense: 90 tablet Refills: 0 Start: 01/19/2014  Class: Normal    Notes to pharmacy: [facility]. Authorization is required for next refill.    Requested on: 07/26/2013    Originally ordered on: 04/01/2013 Last refill: 10/23/2013 Order History and Details

## 2014-01-24 ENCOUNTER — Other Ambulatory Visit: Payer: Self-pay | Admitting: *Deleted

## 2014-01-24 NOTE — Telephone Encounter (Signed)
Refill req Amlodipine

## 2014-01-25 MED ORDER — AMLODIPINE BESYLATE 5 MG PO TABS: 5.0000 mg | ORAL_TABLET | Freq: Every day | ORAL | Status: DC

## 2014-02-23 ENCOUNTER — Other Ambulatory Visit: Payer: Self-pay | Admitting: *Deleted

## 2014-02-23 MED ORDER — AMLODIPINE BESYLATE 5 MG PO TABS: 5.0000 mg | ORAL_TABLET | Freq: Every day | ORAL | Status: DC

## 2014-02-23 NOTE — Telephone Encounter (Signed)
Received refill request form Veronica Simpson for 90 day supply

## 2014-02-25 ENCOUNTER — Encounter: Payer: Self-pay | Admitting: Internal Medicine

## 2014-03-04 ENCOUNTER — Other Ambulatory Visit: Payer: Self-pay | Admitting: Internal Medicine

## 2014-03-07 NOTE — Telephone Encounter (Signed)
Requested Medications     Medication name:  Name from pharmacy:  atorvastatin (LIPITOR) 40 MG tablet  ATORVASTATIN CALCIUM 40MG  TAB    Sig: TAKE 1 TABLET (40 MG TOTAL) BY MOUTH DAILY.    Dispense: 90 tablet Refills: 0 Start: 03/04/2014  Class: Normal    Notes to pharmacy: [facility]. Authorization is required for next refill.    Requested on: 09/10/2013    Originally ordered on: 04/01/2013 Last refill: 12/05/2013 Order History and Details              Call

## 2014-03-08 ENCOUNTER — Other Ambulatory Visit: Payer: Self-pay | Admitting: Internal Medicine

## 2014-03-08 ENCOUNTER — Encounter: Payer: BC Managed Care – PPO | Admitting: Internal Medicine

## 2014-03-08 MED ORDER — ATORVASTATIN CALCIUM 40 MG PO TABS: ORAL_TABLET | ORAL | Status: DC

## 2014-03-08 NOTE — Progress Notes (Signed)
Subjective:    Patient ID: Veronica Simpson, female    DOB: 1946-04-24, 68 y.o.   MRN: [id number]  HPI [name] is here for follow up  No Known Allergies Past Medical History  Diagnosis Date  . Thyroid disease   . Hyperlipidemia   . Hypertension   . Depression   . Osteopenia    Past Surgical History  Procedure Laterality Date  . Cholecystectomy    . Abdominal hysterectomy    . Wrist surgery Left   . Tubal ligation    . Gstric bypass     History   Social History  . Marital Status: Married    Spouse Name: N/A    Number of Children: N/A  . Years of Education: N/A   Occupational History  . Not on file.   Social History Main Topics  . Smoking status: Former Smoker    Quit date: 04/01/1977  . Smokeless tobacco: Never Used  . Alcohol Use: No  . Drug Use: No  . Sexual Activity: Yes    Birth Control/ Protection: Post-menopausal   Other Topics Concern  . Not on file   Social History Narrative  . No narrative on file   Family History  Problem Relation Age of Onset  . Depression Mother   . Arthritis Mother   . Dementia Mother   . Hyperlipidemia Mother   . Hypertension Mother   . Cancer Father     lymphoma  . Depression Father   . Diabetes Father   . Arthritis Father   . Cancer Sister     stomach  . Cancer Brother     prostate  . Cancer Maternal Grandmother     stomach  . Heart disease Maternal Grandfather   . Cancer Paternal Grandmother     multiple myloma  . Heart disease Paternal Grandfather    Patient Active Problem List   Diagnosis Date Noted  . Sinus bradycardia by electrocardiogram 09/08/2013  . Atrophic vaginitis 09/08/2013  . History of gastric stapling 09/08/2013  . 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 Osteoporosis 05/09/2013  . History of TIA (transient ischemic attack) 05/09/2013  . Urinary urgency 04/12/2013  . Vaginal dryness 04/12/2013  . HTN (hypertension) 04/01/2013  . Hyperlipidemia 04/01/2013  . Hypothyroidism 04/01/2013  .  Dyspareunia 04/01/2013  . Depression 04/01/2013  . S/P total hysterectomy and bilateral salpingo-oophorectomy 04/01/2013  . Menopause 04/01/2013  . History of sleep apnea 04/01/2013  . Edema, lower extremity 04/01/2013  . Osteopenia    Current Outpatient Prescriptions on File Prior to Visit  Medication Sig Dispense Refill  . amLODipine (NORVASC) 5 MG tablet Take 1 tablet (5 mg total) by mouth daily.  90 tablet  1  . aspirin 325 MG tablet Take 325 mg by mouth daily.      Marland Kitchen atenolol (TENORMIN) 50 MG tablet TAKE ONE TABLET BY MOUTH DAILY  90 tablet  1  . atorvastatin (LIPITOR) 40 MG tablet TAKE 1 TABLET (40 MG TOTAL) BY MOUTH DAILY.  90 tablet  1  . Cholecalciferol (VITAMIN D-3) 1000 UNITS CAPS Take 1 capsule by mouth daily.      Marland Kitchen co-enzyme Q-10 30 MG capsule Take 30 mg by mouth daily.      . cyclobenzaprine (FLEXERIL) 10 MG tablet Take one at hs and can take 1/2 tablet during the day  20 tablet  0  . FLUoxetine (PROZAC) 40 MG capsule Take 1 capsule (40 mg total) by mouth daily.  90 capsule  1  .  ibandronate (BONIVA) 150 MG tablet Take 1 tablet (150 mg total) by mouth every 30 (thirty) days. Take in the morning with a full glass of water, on an empty stomach, and do not take anything else by mouth or lie down for the next 30 min.  3 tablet  2  . ibuprofen (ADVIL,MOTRIN) 800 MG tablet Take one every 8 hours with food  30 tablet  1  . levothyroxine (SYNTHROID, LEVOTHROID) 88 MCG tablet Take 1 tablet (88 mcg total) by mouth daily before breakfast.  90 tablet  1  . lisinopril-hydrochlorothiazide (PRINZIDE,ZESTORETIC) 20-25 MG per tablet Take 1 tablet by mouth daily.  90 tablet  3  . Misc Natural Products (OSTEO BI-FLEX ADV TRIPLE ST PO) Take 2 capsules by mouth daily.      . Multiple Vitamins-Minerals (MULTIVITAMIN WITH MINERALS) tablet Take 1 tablet by mouth daily.      . vitamin C (ASCORBIC ACID) 500 MG tablet Take 500 mg by mouth daily.       No current facility-administered medications on  file prior to visit.       Review of Systems See HPi    Objective:   Physical Exam  Physical Exam  Nursing note and vitals reviewed.  Constitutional: She is oriented to person, place, and time. She appears well-developed and well-nourished.  HENT:  Head: Normocephalic and atraumatic.  Cardiovascular: Normal rate and regular rhythm. Exam reveals no gallop and no friction rub.  No murmur heard.  Pulmonary/Chest: Breath sounds normal. She has no wheezes. She has no rales.  Neurological: She is alert and oriented to person, place, and time.  Skin: Skin is warm and dry.  Psychiatric: She has a normal mood and affect. Her behavior is normal.             Assessment & Plan:

## 2014-03-21 ENCOUNTER — Encounter: Payer: Self-pay | Admitting: Internal Medicine

## 2014-03-21 ENCOUNTER — Ambulatory Visit (INDEPENDENT_AMBULATORY_CARE_PROVIDER_SITE_OTHER): Payer: BC Managed Care – PPO | Admitting: Internal Medicine

## 2014-03-21 VITALS — BP 113/65 | HR 52 | Resp 16 | Ht 63.0 in | Wt 232.0 lb

## 2014-03-21 DIAGNOSIS — IMO0002 Reserved for concepts with insufficient information to code with codable children: Secondary | ICD-10-CM | POA: Diagnosis not present

## 2014-03-21 DIAGNOSIS — I10 Essential (primary) hypertension: Secondary | ICD-10-CM | POA: Diagnosis not present

## 2014-03-21 DIAGNOSIS — E038 Other specified hypothyroidism: Secondary | ICD-10-CM | POA: Diagnosis not present

## 2014-03-21 DIAGNOSIS — E785 Hyperlipidemia, unspecified: Secondary | ICD-10-CM

## 2014-03-21 LAB — COMPREHENSIVE METABOLIC PANEL
ALK PHOS: 61 U/L (ref 39–117)
ALT: 26 U/L (ref 0–35)
AST: 30 U/L (ref 0–37)
Albumin: 4.3 g/dL (ref 3.5–5.2)
BILIRUBIN TOTAL: 2.5 mg/dL — AB (ref 0.2–1.2)
BUN: 13 mg/dL (ref 6–23)
CO2: 33 mEq/L — ABNORMAL HIGH (ref 19–32)
Calcium: 9.6 mg/dL (ref 8.4–10.5)
Chloride: 102 mEq/L (ref 96–112)
Creat: 0.72 mg/dL (ref 0.50–1.10)
Glucose, Bld: 110 mg/dL — ABNORMAL HIGH (ref 70–99)
Potassium: 3.8 mEq/L (ref 3.5–5.3)
SODIUM: 144 meq/L (ref 135–145)
Total Protein: 6.4 g/dL (ref 6.0–8.3)

## 2014-03-21 LAB — CBC WITH DIFFERENTIAL/PLATELET
BASOS PCT: 1 % (ref 0–1)
Basophils Absolute: 0.1 10*3/uL (ref 0.0–0.1)
EOS ABS: 0.4 10*3/uL (ref 0.0–0.7)
Eosinophils Relative: 6 % — ABNORMAL HIGH (ref 0–5)
HCT: 41.5 % (ref 36.0–46.0)
HEMOGLOBIN: 14.5 g/dL (ref 12.0–15.0)
Lymphocytes Relative: 31 % (ref 12–46)
Lymphs Abs: 1.9 10*3/uL (ref 0.7–4.0)
MCH: 31.2 pg (ref 26.0–34.0)
MCHC: 34.9 g/dL (ref 30.0–36.0)
MCV: 89.2 fL (ref 78.0–100.0)
MONO ABS: 0.7 10*3/uL (ref 0.1–1.0)
Monocytes Relative: 11 % (ref 3–12)
NEUTROS PCT: 51 % (ref 43–77)
Neutro Abs: 3.1 10*3/uL (ref 1.7–7.7)
Platelets: 297 10*3/uL (ref 150–400)
RBC: 4.65 MIL/uL (ref 3.87–5.11)
RDW: 14 % (ref 11.5–15.5)
WBC: 6 10*3/uL (ref 4.0–10.5)

## 2014-03-21 LAB — LIPID PANEL
Cholesterol: 127 mg/dL (ref 0–200)
HDL: 49 mg/dL (ref >39)
LDL CALC: 55 mg/dL (ref 0–99)
TRIGLYCERIDES: 113 mg/dL (ref <150)
Total CHOL/HDL Ratio: 2.6 Ratio
VLDL: 23 mg/dL (ref 0–40)

## 2014-03-21 LAB — TSH: TSH: 0.791 u[IU]/mL (ref 0.350–4.500)

## 2014-03-21 NOTE — Patient Instructions (Addendum)
Schedule CPE    To lab today

## 2014-03-21 NOTE — Progress Notes (Signed)
Subjective:    Patient ID: Veronica Simpson, female    DOB: 10/09/1945, 68 y.o.   MRN: [id number]  HPI  Veronica Simpson is here for follow up.  She is very happy as she has lost 32 lbs by her scale  - she is seeing a dietician in the [place] program and has decreased her carbs and is exercising.   HTN:   She is on 3 drug regimen  Atenolol,  Amlodipine and lisinopril/hctz.   Tolerating meds well   Hyperlipdemia  On Lipitor   She is fasting today   Hypothyroidism  Tolerating med  No Known Allergies Past Medical History  Diagnosis Date  . Thyroid disease   . Hyperlipidemia   . Hypertension   . Depression   . Osteopenia    Past Surgical History  Procedure Laterality Date  . Cholecystectomy    . Abdominal hysterectomy    . Wrist surgery Left   . Tubal ligation    . Gstric bypass     History   Social History  . Marital Status: Married    Spouse Name: N/A    Number of Children: N/A  . Years of Education: N/A   Occupational History  . Not on file.   Social History Main Topics  . Smoking status: Former Smoker    Quit date: 04/01/1977  . Smokeless tobacco: Never Used  . Alcohol Use: No  . Drug Use: No  . Sexual Activity: Yes    Birth Control/ Protection: Post-menopausal   Other Topics Concern  . Not on file   Social History Narrative  . No narrative on file   Family History  Problem Relation Age of Onset  . Depression Mother   . Arthritis Mother   . Dementia Mother   . Hyperlipidemia Mother   . Hypertension Mother   . Cancer Father     lymphoma  . Depression Father   . Diabetes Father   . Arthritis Father   . Cancer Sister     stomach  . Cancer Brother     prostate  . Cancer Maternal Grandmother     stomach  . Heart disease Maternal Grandfather   . Cancer Paternal Grandmother     multiple myloma  . Heart disease Paternal Grandfather    Patient Active Problem List   Diagnosis Date Noted  . Sinus bradycardia by electrocardiogram 09/08/2013  . Atrophic  vaginitis 09/08/2013  . History of gastric stapling 09/08/2013  . 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 Osteoporosis 05/09/2013  . History of TIA (transient ischemic attack) 05/09/2013  . Urinary urgency 04/12/2013  . Vaginal dryness 04/12/2013  . HTN (hypertension) 04/01/2013  . Hyperlipidemia 04/01/2013  . Hypothyroidism 04/01/2013  . Dyspareunia 04/01/2013  . Depression 04/01/2013  . S/P total hysterectomy and bilateral salpingo-oophorectomy 04/01/2013  . Menopause 04/01/2013  . History of sleep apnea 04/01/2013  . Edema, lower extremity 04/01/2013  . Osteopenia    Current Outpatient Prescriptions on File Prior to Visit  Medication Sig Dispense Refill  . amLODipine (NORVASC) 5 MG tablet Take 1 tablet (5 mg total) by mouth daily.  90 tablet  1  . aspirin 325 MG tablet Take 325 mg by mouth daily.      Marland Kitchen atenolol (TENORMIN) 50 MG tablet TAKE ONE TABLET BY MOUTH DAILY  90 tablet  1  . atorvastatin (LIPITOR) 40 MG tablet Take one daily  30 tablet  0  . Cholecalciferol (VITAMIN D-3) 1000 UNITS CAPS Take 1 capsule by mouth  daily.      . co-enzyme Q-10 30 MG capsule Take 30 mg by mouth daily.      . cyclobenzaprine (FLEXERIL) 10 MG tablet Take one at hs and can take 1/2 tablet during the day  20 tablet  0  . FLUoxetine (PROZAC) 40 MG capsule Take 1 capsule (40 mg total) by mouth daily.  90 capsule  1  . ibandronate (BONIVA) 150 MG tablet Take 1 tablet (150 mg total) by mouth every 30 (thirty) days. Take in the morning with a full glass of water, on an empty stomach, and do not take anything else by mouth or lie down for the next 30 min.  3 tablet  2  . ibuprofen (ADVIL,MOTRIN) 800 MG tablet Take one every 8 hours with food  30 tablet  1  . levothyroxine (SYNTHROID, LEVOTHROID) 88 MCG tablet Take 1 tablet (88 mcg total) by mouth daily before breakfast.  90 tablet  1  . lisinopril-hydrochlorothiazide (PRINZIDE,ZESTORETIC) 20-25 MG per tablet Take 1 tablet by mouth daily.  90 tablet  3  . Misc  Natural Products (OSTEO BI-FLEX ADV TRIPLE ST PO) Take 2 capsules by mouth daily.      . Multiple Vitamins-Minerals (MULTIVITAMIN WITH MINERALS) tablet Take 1 tablet by mouth daily.      . vitamin C (ASCORBIC ACID) 500 MG tablet Take 500 mg by mouth daily.       No current facility-administered medications on file prior to visit.      Review of Systems See HPI    Objective:   Physical Exam Physical Exam  Nursing note and vitals reviewed.  Constitutional: She is oriented to person, place, and time. She appears well-developed and well-nourished.  HENT:  Head: Normocephalic and atraumatic.  Cardiovascular: Normal rate and regular rhythm. Exam reveals no gallop and no friction rub.  No murmur heard.  Pulmonary/Chest: Breath sounds normal. She has no wheezes. She has no rales.  Neurological: She is alert and oriented to person, place, and time.  Skin: Skin is warm and dry.  Psychiatric: She has a normal mood and affect. Her behavior is normal.         Assessment & Plan:  HTN  Continue 3 meds for now  Hyperlipidemia  Will get fasting levels  Today  Hypothyroidism:   Will check today  Wt loss:  Wonderful  Keep up the good work

## 2014-03-22 ENCOUNTER — Encounter: Payer: Self-pay | Admitting: Internal Medicine

## 2014-03-22 LAB — VITAMIN D 25 HYDROXY (VIT D DEFICIENCY, FRACTURES): Vit D, 25-Hydroxy: 62 ng/mL (ref 30–89)

## 2014-03-24 ENCOUNTER — Encounter: Payer: Self-pay | Admitting: *Deleted

## 2014-04-06 ENCOUNTER — Other Ambulatory Visit: Payer: Self-pay | Admitting: Internal Medicine

## 2014-04-06 NOTE — Telephone Encounter (Signed)
Refill request for Prozac/ lisinopril/HCTZ

## 2014-04-11 ENCOUNTER — Other Ambulatory Visit: Payer: Self-pay | Admitting: Internal Medicine

## 2014-04-11 NOTE — Telephone Encounter (Signed)
Refill request for Lipitor

## 2014-04-14 ENCOUNTER — Encounter: Payer: Self-pay | Admitting: Internal Medicine

## 2014-04-14 NOTE — Telephone Encounter (Signed)
This is a Pharmacist, community question from a patient.

## 2014-04-18 ENCOUNTER — Telehealth: Payer: Self-pay | Admitting: Internal Medicine

## 2014-04-18 NOTE — Telephone Encounter (Signed)
I have patient scheduled for 05/04/14 @ 8:15-eh

## 2014-04-18 NOTE — Telephone Encounter (Signed)
Veronica Simpson    Call pt and tell her that I want to see her in office at her convienence regarding all these issues.   I want to check and AIC when I see her and any adjustement in meds we need to discuss in office '  Give her 30 min visit no urgency

## 2014-05-04 ENCOUNTER — Encounter: Payer: Self-pay | Admitting: Internal Medicine

## 2014-05-04 ENCOUNTER — Ambulatory Visit (INDEPENDENT_AMBULATORY_CARE_PROVIDER_SITE_OTHER): Payer: BC Managed Care – PPO | Admitting: Internal Medicine

## 2014-05-04 VITALS — BP 127/84 | HR 54 | Temp 98.0°F | Resp 16 | Ht 63.0 in | Wt 234.0 lb

## 2014-05-04 DIAGNOSIS — R739 Hyperglycemia, unspecified: Secondary | ICD-10-CM

## 2014-05-04 DIAGNOSIS — R7309 Other abnormal glucose: Secondary | ICD-10-CM | POA: Diagnosis not present

## 2014-05-04 DIAGNOSIS — I10 Essential (primary) hypertension: Secondary | ICD-10-CM | POA: Diagnosis not present

## 2014-05-04 DIAGNOSIS — R634 Abnormal weight loss: Secondary | ICD-10-CM

## 2014-05-04 LAB — GLUCOSE, POCT (MANUAL RESULT ENTRY): POC GLUCOSE: 100 mg/dL — AB (ref 70–99)

## 2014-05-04 NOTE — Progress Notes (Signed)
Subjective:    Patient ID: Veronica Simpson, female    DOB: 03/19/1946, 68 y.o.   MRN: [id number]  HPI priror OV HTN Continue 3 meds for now  Hyperlipidemia Will get fasting levels Today  Hypothyroidism: Will check today  Wt loss: Wonderful Keep up the good work       Today   [name] reports she has lost 34 lbs on her scales at home .    She is also exercising at [employer] .     Recent family deaths -brother in law age 23 and  Niece's husband at age 28.  She is handling OK but feels a little anxious at times.  Sleeping OK  See BP  She would like to try tro come off her norvasc.  No Known Allergies Past Medical History  Diagnosis Date  . Thyroid disease   . Hyperlipidemia   . Hypertension   . Depression   . Osteopenia    Past Surgical History  Procedure Laterality Date  . Cholecystectomy    . Abdominal hysterectomy    . Wrist surgery Left   . Tubal ligation    . Gstric bypass     History   Social History  . Marital Status: Married    Spouse Name: N/A    Number of Children: N/A  . Years of Education: N/A   Occupational History  . Not on file.   Social History Main Topics  . Smoking status: Former Smoker    Quit date: 04/01/1977  . Smokeless tobacco: Never Used  . Alcohol Use: No  . Drug Use: No  . Sexual Activity: Yes    Birth Control/ Protection: Post-menopausal   Other Topics Concern  . Not on file   Social History Narrative  . No narrative on file   Family History  Problem Relation Age of Onset  . Depression Mother   . Arthritis Mother   . Dementia Mother   . Hyperlipidemia Mother   . Hypertension Mother   . Cancer Father     lymphoma  . Depression Father   . Diabetes Father   . Arthritis Father   . Cancer Sister     stomach  . Cancer Brother     prostate  . Cancer Maternal Grandmother     stomach  . Heart disease Maternal Grandfather   . Cancer Paternal Grandmother     multiple myloma  . Heart disease Paternal Grandfather     Patient Active Problem List   Diagnosis Date Noted  . Sinus bradycardia by electrocardiogram 09/08/2013  . Atrophic vaginitis 09/08/2013  . History of gastric stapling 09/08/2013  . 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 Osteoporosis 05/09/2013  . History of TIA (transient ischemic attack) 05/09/2013  . Urinary urgency 04/12/2013  . Vaginal dryness 04/12/2013  . HTN (hypertension) 04/01/2013  . Hyperlipidemia 04/01/2013  . Hypothyroidism 04/01/2013  . Dyspareunia 04/01/2013  . Depression 04/01/2013  . S/P total hysterectomy and bilateral salpingo-oophorectomy 04/01/2013  . Menopause 04/01/2013  . History of sleep apnea 04/01/2013  . Edema, lower extremity 04/01/2013  . Osteopenia    Current Outpatient Prescriptions on File Prior to Visit  Medication Sig Dispense Refill  . amLODipine (NORVASC) 5 MG tablet Take 1 tablet (5 mg total) by mouth daily.  90 tablet  1  . aspirin 325 MG tablet Take 325 mg by mouth daily.      Marland Kitchen atenolol (TENORMIN) 50 MG tablet TAKE ONE TABLET BY MOUTH DAILY  90 tablet  1  . atorvastatin (LIPITOR) 40 MG tablet TAKE ONE TABLET BY MOUTH DAILY  90 tablet  1  . Cholecalciferol (VITAMIN D-3) 1000 UNITS CAPS Take 1 capsule by mouth daily.      Marland Kitchen co-enzyme Q-10 30 MG capsule Take 30 mg by mouth daily.      . cyclobenzaprine (FLEXERIL) 10 MG tablet Take one at hs and can take 1/2 tablet during the day  20 tablet  0  . FLUoxetine (PROZAC) 40 MG capsule Take 1 capsule (40 mg total) by mouth daily.  90 capsule  1  . FLUoxetine (PROZAC) 40 MG capsule TAKE 1 CAPSULE (40 MG TOTAL) BY MOUTH DAILY.  90 capsule  0  . ibandronate (BONIVA) 150 MG tablet Take 1 tablet (150 mg total) by mouth every 30 (thirty) days. Take in the morning with a full glass of water, on an empty stomach, and do not take anything else by mouth or lie down for the next 30 min.  3 tablet  2  . ibuprofen (ADVIL,MOTRIN) 800 MG tablet Take one every 8 hours with food  30 tablet  1  . levothyroxine  (SYNTHROID, LEVOTHROID) 88 MCG tablet Take 1 tablet (88 mcg total) by mouth daily before breakfast.  90 tablet  1  . lisinopril-hydrochlorothiazide (PRINZIDE,ZESTORETIC) 20-25 MG per tablet TAKE 1 TABLET BY MOUTH DAILY.  90 tablet  2  . Misc Natural Products (OSTEO BI-FLEX ADV TRIPLE ST PO) Take 2 capsules by mouth daily.      . Multiple Vitamins-Minerals (MULTIVITAMIN WITH MINERALS) tablet Take 1 tablet by mouth daily.      . vitamin C (ASCORBIC ACID) 500 MG tablet Take 500 mg by mouth daily.       No current facility-administered medications on file prior to visit.       Review of Systems    see HPI Objective:   Physical Exam Physical Exam  Nursing note and vitals reviewed.   Rpeat BP  120/82 Constitutional: She is oriented to person, place, and time. She appears well-developed and well-nourished.  HENT:  Head: Normocephalic and atraumatic.  Cardiovascular: Normal rate and regular rhythm. Exam reveals no gallop and no friction rub.  No murmur heard.  Pulmonary/Chest: Breath sounds normal. She has no wheezes. She has no rales.  Neurological: She is alert and oriented to person, place, and time.  Skin: Skin is warm and dry.  Ext:  No edema  Psychiatric: She has a normal mood and affect. Her behavior is normal.              Assessment & Plan:  HTN:  Ok to try off NOovasc  Continue atenolon and lisinopril/hctz  Elevated glucose Fasing Accucheck 100 in office    Intentional wt loss  Doing great    Had flu vaccine at [facility]  Intentional wtl loss

## 2014-05-04 NOTE — Patient Instructions (Signed)
See me in December

## 2014-05-17 ENCOUNTER — Other Ambulatory Visit: Payer: Self-pay | Admitting: Internal Medicine

## 2014-05-17 NOTE — Telephone Encounter (Signed)
Refill request

## 2014-05-30 ENCOUNTER — Encounter: Payer: Self-pay | Admitting: Internal Medicine

## 2014-07-04 ENCOUNTER — Encounter: Payer: Self-pay | Admitting: Internal Medicine

## 2014-07-04 ENCOUNTER — Ambulatory Visit (INDEPENDENT_AMBULATORY_CARE_PROVIDER_SITE_OTHER): Payer: BC Managed Care – PPO | Admitting: Internal Medicine

## 2014-07-04 VITALS — BP 137/74 | HR 55 | Temp 98.2°F | Resp 16 | Ht 62.75 in | Wt 234.0 lb

## 2014-07-04 DIAGNOSIS — E119 Type 2 diabetes mellitus without complications: Secondary | ICD-10-CM

## 2014-07-04 DIAGNOSIS — I10 Essential (primary) hypertension: Secondary | ICD-10-CM

## 2014-07-04 DIAGNOSIS — R634 Abnormal weight loss: Secondary | ICD-10-CM

## 2014-07-04 DIAGNOSIS — R7309 Other abnormal glucose: Secondary | ICD-10-CM

## 2014-07-04 LAB — GLUCOSE, POCT (MANUAL RESULT ENTRY): POC GLUCOSE: 90 mg/dL (ref 70–99)

## 2014-07-04 NOTE — Progress Notes (Signed)
Subjective:    Patient ID: Veronica Simpson, female    DOB: 1946/02/28, 68 y.o.   MRN: [id number]  HPI  10/7/ HTN: Ok to try off NOovasc Continue atenolon and lisinopril/hctz  Elevated glucose Fasing Accucheck 100 in office   Intentional wt loss Doing great   Had flu vaccine at Hospice  Intentional wtl loss     TODAY:  Adalynd returns for follow up HTN,  Weight loss,  Elevated glucose  HTN  Last visit we stopped her norvasc    Intentional wt loss  She is happy she has not gained weight over holidays  Elevated glucose       No Known Allergies Past Medical History  Diagnosis Date  . Thyroid disease   . Hyperlipidemia   . Hypertension   . Depression   . Osteopenia    Past Surgical History  Procedure Laterality Date  . Cholecystectomy    . Abdominal hysterectomy    . Wrist surgery Left   . Tubal ligation    . Gstric bypass     History   Social History  . Marital Status: Married    Spouse Name: N/A    Number of Children: N/A  . Years of Education: N/A   Occupational History  . Not on file.   Social History Main Topics  . Smoking status: Former Smoker    Quit date: 04/01/1977  . Smokeless tobacco: Never Used  . Alcohol Use: No  . Drug Use: No  . Sexual Activity: Yes    Birth Control/ Protection: Post-menopausal   Other Topics Concern  . Not on file   Social History Narrative   Family History  Problem Relation Age of Onset  . Depression Mother   . Arthritis Mother   . Dementia Mother   . Hyperlipidemia Mother   . Hypertension Mother   . Cancer Father     lymphoma  . Depression Father   . Diabetes Father   . Arthritis Father   . Cancer Sister     stomach  . Cancer Brother     prostate  . Cancer Maternal Grandmother     stomach  . Heart disease Maternal Grandfather   . Cancer Paternal Grandmother     multiple myloma  . Heart disease Paternal Grandfather    Patient Active Problem List   Diagnosis Date Noted  . Weight  loss 05/04/2014  . Sinus bradycardia by electrocardiogram 09/08/2013  . Atrophic vaginitis 09/08/2013  . History of gastric stapling 09/08/2013  . 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 Osteoporosis 05/09/2013  . History of TIA (transient ischemic attack) 05/09/2013  . Urinary urgency 04/12/2013  . Vaginal dryness 04/12/2013  . HTN (hypertension) 04/01/2013  . Hyperlipidemia 04/01/2013  . Hypothyroidism 04/01/2013  . Dyspareunia 04/01/2013  . Depression 04/01/2013  . S/P total hysterectomy and bilateral salpingo-oophorectomy 04/01/2013  . Menopause 04/01/2013  . History of sleep apnea 04/01/2013  . Edema, lower extremity 04/01/2013  . Osteopenia    Current Outpatient Prescriptions on File Prior to Visit  Medication Sig Dispense Refill  . amLODipine (NORVASC) 5 MG tablet Take 1 tablet (5 mg total) by mouth daily. 90 tablet 1  . aspirin 325 MG tablet Take 325 mg by mouth daily.    Marland Kitchen atenolol (TENORMIN) 50 MG tablet TAKE ONE TABLET BY MOUTH DAILY 90 tablet 1  . atorvastatin (LIPITOR) 40 MG tablet TAKE ONE TABLET BY MOUTH DAILY 90 tablet 1  . Cholecalciferol (VITAMIN D-3) 1000 UNITS CAPS Take  1 capsule by mouth daily.    Marland Kitchen co-enzyme Q-10 30 MG capsule Take 30 mg by mouth daily.    . [facility] CINNAMON PO Take 1,000 mg by mouth daily.    Marland Kitchen FLUoxetine (PROZAC) 40 MG capsule Take 1 capsule (40 mg total) by mouth daily. 90 capsule 1  . ibandronate (BONIVA) 150 MG tablet Take 1 tablet (150 mg total) by mouth every 30 (thirty) days. Take in the morning with a full glass of water, on an empty stomach, and do not take anything else by mouth or lie down for the next 30 min. 3 tablet 2  . ibuprofen (ADVIL,MOTRIN) 800 MG tablet Take one every 8 hours with food 30 tablet 1  . levothyroxine (SYNTHROID, LEVOTHROID) 88 MCG tablet TAKE ONE TABLET BY MOUTH ONCE DAILY BEFORE  BREAKFAST 90 tablet 2  . lisinopril-hydrochlorothiazide (PRINZIDE,ZESTORETIC) 20-25 MG per tablet TAKE 1 TABLET BY MOUTH DAILY. 90 tablet 2  .  Misc Natural Products (OSTEO BI-FLEX ADV TRIPLE ST PO) Take 2 capsules by mouth daily.    . Multiple Vitamins-Minerals (MULTIVITAMIN WITH MINERALS) tablet Take 1 tablet by mouth daily.    . vitamin C (ASCORBIC ACID) 500 MG tablet Take 500 mg by mouth daily.     No current facility-administered medications on file prior to visit.      Review of Systems    see HPI Objective:   Physical Exam Physical Exam  Nursing note and vitals reviewed.  Constitutional: She is oriented to person, place, and time. She appears well-developed and well-nourished.  HENT:  Head: Normocephalic and atraumatic.  Cardiovascular: Normal rate and regular rhythm. Exam reveals no gallop and no friction rub.  No murmur heard.  Pulmonary/Chest: Breath sounds normal. She has no wheezes. She has no rales.  Neurological: She is alert and oriented to person, place, and time.  Skin: Skin is warm and dry.  Psychiatric: She has a normal mood and affect. Her behavior is normal.        Assessment & Plan:  HTN:  BP great off norvasc  Continue tenormin and lisinopril /hctz.  If still normal ,  May try backing off her of her tenormin  Elevated glucose  Fasting in office  90  Intentional weight loss    See me in next year

## 2014-07-04 NOTE — Patient Instructions (Signed)
See me late February /early March .

## 2014-07-07 ENCOUNTER — Other Ambulatory Visit: Payer: Self-pay | Admitting: *Deleted

## 2014-07-07 ENCOUNTER — Telehealth: Payer: Self-pay

## 2014-07-07 NOTE — Telephone Encounter (Signed)
Refill request

## 2014-07-07 NOTE — Telephone Encounter (Signed)
[facility] called said they had received something back saying this patient was unknown to subscriber. Could you look into this and get this refill request in. It looks like she is still our patient. Not sure what happened   ibandronate (BONIVA) 150 MG tablet

## 2014-07-08 MED ORDER — IBANDRONATE SODIUM 150 MG PO TABS: 150.0000 mg | ORAL_TABLET | ORAL | Status: DC

## 2014-07-09 ENCOUNTER — Encounter: Payer: Self-pay | Admitting: Internal Medicine

## 2014-07-09 ENCOUNTER — Other Ambulatory Visit: Payer: Self-pay | Admitting: Internal Medicine

## 2014-07-11 NOTE — Telephone Encounter (Signed)
Refill request

## 2014-09-07 ENCOUNTER — Encounter: Payer: Self-pay | Admitting: Internal Medicine

## 2014-09-14 ENCOUNTER — Encounter: Payer: BC Managed Care – PPO | Admitting: Internal Medicine

## 2014-09-20 ENCOUNTER — Ambulatory Visit (INDEPENDENT_AMBULATORY_CARE_PROVIDER_SITE_OTHER): Payer: 59 | Admitting: Internal Medicine

## 2014-09-20 ENCOUNTER — Encounter: Payer: Self-pay | Admitting: Internal Medicine

## 2014-09-20 VITALS — BP 134/80 | HR 56 | Resp 16 | Ht 62.75 in | Wt 240.0 lb

## 2014-09-20 DIAGNOSIS — I10 Essential (primary) hypertension: Secondary | ICD-10-CM

## 2014-09-20 DIAGNOSIS — T50905A Adverse effect of unspecified drugs, medicaments and biological substances, initial encounter: Secondary | ICD-10-CM

## 2014-09-20 LAB — BASIC METABOLIC PANEL
BUN: 15 mg/dL (ref 6–23)
CALCIUM: 9.7 mg/dL (ref 8.4–10.5)
CHLORIDE: 99 meq/L (ref 96–112)
CO2: 33 mEq/L — ABNORMAL HIGH (ref 19–32)
CREATININE: 0.68 mg/dL (ref 0.50–1.10)
Glucose, Bld: 104 mg/dL — ABNORMAL HIGH (ref 70–99)
Potassium: 3.3 mEq/L — ABNORMAL LOW (ref 3.5–5.3)
Sodium: 140 mEq/L (ref 135–145)

## 2014-09-20 MED ORDER — HYDROCHLOROTHIAZIDE 25 MG PO TABS: 25.0000 mg | ORAL_TABLET | Freq: Every day | ORAL | Status: DC

## 2014-09-20 MED ORDER — LISINOPRIL 40 MG PO TABS: 40.0000 mg | ORAL_TABLET | Freq: Every day | ORAL | Status: AC

## 2014-09-20 NOTE — Progress Notes (Signed)
Subjective:    Patient ID: Veronica Simpson, female    DOB: Dec 06, 1945, 69 y.o.   MRN: [id number]  HPI 07/04/2014 note Assessment & Plan:  HTN: BP great off norvasc Continue tenormin and lisinopril /hctz. If still normal , May try backing off her of her tenormin  Elevated glucose Fasting in office 90  Intentional weight loss   See me in next year       TODAY  Blue comes in for acute visit.  She reports that BP has been elevated .  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   Pt had headache.   She then went to [facility] urgent care and was told to take more HCTZ   For the last 5 days she has been taking an extra Lisinopril /HCTZ  20/25.  - 2 per day   Pt is confused about her medication  And did not understand that she had two BP meds combined  No Known Allergies Past Medical History  Diagnosis Date  . Thyroid disease   . Hyperlipidemia   . Hypertension   . Depression   . Osteopenia    Past Surgical History  Procedure Laterality Date  . Cholecystectomy    . Abdominal hysterectomy    . Wrist surgery Left   . Tubal ligation    . Gstric bypass     History   Social History  . Marital Status: Married    Spouse Name: N/A  . Number of Children: N/A  . Years of Education: N/A   Occupational History  . Not on file.   Social History Main Topics  . Smoking status: Former Smoker    Quit date: 04/01/1977  . Smokeless tobacco: Never Used  . Alcohol Use: No  . Drug Use: No  . Sexual Activity: Yes    Birth Control/ Protection: Post-menopausal   Other Topics Concern  . Not on file   Social History Narrative   Family History  Problem Relation Age of Onset  . Depression Mother   . Arthritis Mother   . Dementia Mother   . Hyperlipidemia Mother   . Hypertension Mother   . Cancer Father     lymphoma  . Depression Father   . Diabetes Father   . Arthritis Father   . Cancer Sister     stomach  . Cancer Brother     prostate  .  Cancer Maternal Grandmother     stomach  . Heart disease Maternal Grandfather   . Cancer Paternal Grandmother     multiple myloma  . Heart disease Paternal Grandfather    Patient Active Problem List   Diagnosis Date Noted  . Weight loss 05/04/2014  . Sinus bradycardia by electrocardiogram 09/08/2013  . Atrophic vaginitis 09/08/2013  . History of gastric stapling 09/08/2013  . 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 Osteoporosis 05/09/2013  . History of TIA (transient ischemic attack) 05/09/2013  . Urinary urgency 04/12/2013  . Vaginal dryness 04/12/2013  . HTN (hypertension) 04/01/2013  . Hyperlipidemia 04/01/2013  . Hypothyroidism 04/01/2013  . Dyspareunia 04/01/2013  . Depression 04/01/2013  . S/P total hysterectomy and bilateral salpingo-oophorectomy 04/01/2013  . Menopause 04/01/2013  . History of sleep apnea 04/01/2013  . Edema, lower extremity 04/01/2013  . Osteopenia    Current Outpatient Prescriptions on File Prior to Visit  Medication Sig Dispense Refill  . aspirin 325 MG tablet Take 325 mg by mouth daily.    Marland Kitchen atenolol (TENORMIN) 50  MG tablet TAKE ONE TABLET BY MOUTH ONCE DAILY 90 tablet 1  . atorvastatin (LIPITOR) 40 MG tablet TAKE ONE TABLET BY MOUTH DAILY 90 tablet 1  . Cholecalciferol (VITAMIN D-3) 1000 UNITS CAPS Take 1 capsule by mouth daily.    Marland Kitchen co-enzyme Q-10 30 MG capsule Take 30 mg by mouth daily.    Marland Kitchen FLUoxetine (PROZAC) 40 MG capsule Take 1 capsule (40 mg total) by mouth daily. 90 capsule 1  . Glucosamine-Chondroitin (MOVE FREE PO) Take by mouth.    . ibandronate (BONIVA) 150 MG tablet Take 1 tablet (150 mg total) by mouth every 30 (thirty) days. Take in the morning with a full glass of water, on an empty stomach, and do not take anything else by mouth or lie down for the next 30 min. 3 tablet 2  . ibuprofen (ADVIL,MOTRIN) 800 MG tablet Take one every 8 hours with food 30 tablet 1  . levothyroxine (SYNTHROID, LEVOTHROID) 88 MCG tablet TAKE ONE TABLET BY MOUTH  ONCE DAILY BEFORE  BREAKFAST 90 tablet 2  . lisinopril-hydrochlorothiazide (PRINZIDE,ZESTORETIC) 20-25 MG per tablet TAKE 1 TABLET BY MOUTH DAILY. 90 tablet 2  . Multiple Vitamins-Minerals (MULTIVITAMIN WITH MINERALS) tablet Take 1 tablet by mouth daily.    . vitamin C (ASCORBIC ACID) 500 MG tablet Take 500 mg by mouth daily.     No current facility-administered medications on file prior to visit.       Review of Systems    see hPI Objective:   Physical Exam Physical Exam  Nursing note and vitals reviewed.   Repeat BP  134/80 Constitutional: She is oriented to person, place, and time. She appears well-developed and well-nourished.  HENT:  Head: Normocephalic and atraumatic.  Cardiovascular: Normal rate and regular rhythm. Exam reveals no gallop and no friction rub.  No murmur heard.  Pulmonary/Chest: Breath sounds normal. She has no wheezes. She has no rales.  Neurological: She is alert and oriented to person, place, and time.  Skin: Skin is warm and dry.  Ext  No edema  Psychiatric: She has a normal mood and affect. Her behavior is normal.         Assessment & Plan:  HTN  Now well controlled.    Will give RX for lisinopril  40 mg one daily,  HCTZ  25 mg one daily and she is to conitnue the Tenormin 50 mg one daily   Side effect med  Since she has been taking extra doses since last week will check all labs today  See me in 3-4 weeks

## 2014-09-21 ENCOUNTER — Telehealth: Payer: Self-pay | Admitting: Internal Medicine

## 2014-09-21 MED ORDER — POTASSIUM CHLORIDE CRYS ER 20 MEQ PO TBCR: 20.0000 meq | EXTENDED_RELEASE_TABLET | Freq: Every day | ORAL | Status: DC

## 2014-09-21 NOTE — Telephone Encounter (Signed)
Pt informed of Labs  Will start K-dur 20 meq daily.  Pt advised

## 2014-09-24 ENCOUNTER — Other Ambulatory Visit: Payer: Self-pay | Admitting: Internal Medicine

## 2014-09-26 NOTE — Telephone Encounter (Signed)
Refill request

## 2014-10-18 ENCOUNTER — Ambulatory Visit (INDEPENDENT_AMBULATORY_CARE_PROVIDER_SITE_OTHER): Payer: 59 | Admitting: Internal Medicine

## 2014-10-18 ENCOUNTER — Encounter: Payer: Self-pay | Admitting: Internal Medicine

## 2014-10-18 VITALS — BP 155/76 | HR 51 | Resp 16 | Ht 62.0 in | Wt 244.0 lb

## 2014-10-18 DIAGNOSIS — M810 Age-related osteoporosis without current pathological fracture: Secondary | ICD-10-CM

## 2014-10-18 DIAGNOSIS — I10 Essential (primary) hypertension: Secondary | ICD-10-CM | POA: Diagnosis not present

## 2014-10-18 MED ORDER — POTASSIUM CHLORIDE CRYS ER 20 MEQ PO TBCR: 20.0000 meq | EXTENDED_RELEASE_TABLET | Freq: Every day | ORAL | Status: AC

## 2014-10-18 MED ORDER — FUROSEMIDE 20 MG PO TABS: 20.0000 mg | ORAL_TABLET | Freq: Every day | ORAL | Status: DC

## 2014-10-18 NOTE — Progress Notes (Signed)
Subjective:    Patient ID: Veronica Simpson, female    DOB: 01-25-1946, 69 y.o.   MRN: [id number]  HPI  09/20/2014 note Assessment & Plan:  HTN Now well controlled. Will give RX for lisinopril 40 mg one daily, HCTZ 25 mg one daily and she is to conitnue the Tenormin 50 mg one daily   Side effect med Since she has been taking extra doses since last week will check all labs today  See me in 3-4 weeks         TODAY  Hansika returns for F/U of HTN  Last visit she misunderstood instructions for BP and was taking extra medication incorrectly   HTN  Reports SBP running 130's to 150's at work  She is asymptomatic  Current meds  Lisinopril 40 mg,  HCTZ 25 mg, atenolol 50 mg and K 20 meqs  Osteoprosis  Pt reports she has been on Boniva for past 5-6 years and would like to come off this   No Known Allergies Past Medical History  Diagnosis Date  . Thyroid disease   . Hyperlipidemia   . Hypertension   . Depression   . Osteopenia    Past Surgical History  Procedure Laterality Date  . Cholecystectomy    . Abdominal hysterectomy    . Wrist surgery Left   . Tubal ligation    . Gstric bypass     History   Social History  . Marital Status: Married    Spouse Name: N/A  . Number of Children: N/A  . Years of Education: N/A   Occupational History  . Not on file.   Social History Main Topics  . Smoking status: Former Smoker    Quit date: 04/01/1977  . Smokeless tobacco: Never Used  . Alcohol Use: No  . Drug Use: No  . Sexual Activity: Yes    Birth Control/ Protection: Post-menopausal   Other Topics Concern  . Not on file   Social History Narrative   Family History  Problem Relation Age of Onset  . Depression Mother   . Arthritis Mother   . Dementia Mother   . Hyperlipidemia Mother   . Hypertension Mother   . Cancer Father     lymphoma  . Depression Father   . Diabetes Father   . Arthritis Father   . Cancer Sister     stomach  . Cancer Brother    prostate  . Cancer Maternal Grandmother     stomach  . Heart disease Maternal Grandfather   . Cancer Paternal Grandmother     multiple myloma  . Heart disease Paternal Grandfather    Patient Active Problem List   Diagnosis Date Noted  . Weight loss 05/04/2014  . Sinus bradycardia by electrocardiogram 09/08/2013  . Atrophic vaginitis 09/08/2013  . History of gastric stapling 09/08/2013  . 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 Osteoporosis 05/09/2013  . History of TIA (transient ischemic attack) 05/09/2013  . Urinary urgency 04/12/2013  . Vaginal dryness 04/12/2013  . HTN (hypertension) 04/01/2013  . Hyperlipidemia 04/01/2013  . Hypothyroidism 04/01/2013  . Dyspareunia 04/01/2013  . Depression 04/01/2013  . S/P total hysterectomy and bilateral salpingo-oophorectomy 04/01/2013  . Menopause 04/01/2013  . History of sleep apnea 04/01/2013  . Edema, lower extremity 04/01/2013  . Osteopenia    Current Outpatient Prescriptions on File Prior to Visit  Medication Sig Dispense Refill  . aspirin 325 MG tablet Take 325 mg by mouth daily.    Marland Kitchen atenolol (TENORMIN) 50  MG tablet TAKE ONE TABLET BY MOUTH ONCE DAILY 90 tablet 1  . atorvastatin (LIPITOR) 40 MG tablet TAKE ONE TABLET BY MOUTH DAILY 90 tablet 1  . Cholecalciferol (VITAMIN D-3) 1000 UNITS CAPS Take 1 capsule by mouth daily.    Marland Kitchen co-enzyme Q-10 30 MG capsule Take 30 mg by mouth daily.    Marland Kitchen FLUoxetine (PROZAC) 40 MG capsule TAKE 1 CAPSULE (40 MG TOTAL) BY MOUTH DAILY. 90 capsule 1  . Glucosamine-Chondroitin (MOVE FREE PO) Take by mouth.    . hydrochlorothiazide (HYDRODIURIL) 25 MG tablet Take 1 tablet (25 mg total) by mouth daily. 90 tablet 0  . ibandronate (BONIVA) 150 MG tablet Take 1 tablet (150 mg total) by mouth every 30 (thirty) days. Take in the morning with a full glass of water, on an empty stomach, and do not take anything else by mouth or lie down for the next 30 min. 3 tablet 2  . ibuprofen (ADVIL,MOTRIN) 800 MG tablet Take  one every 8 hours with food 30 tablet 1  . levothyroxine (SYNTHROID, LEVOTHROID) 88 MCG tablet TAKE ONE TABLET BY MOUTH ONCE DAILY BEFORE  BREAKFAST 90 tablet 2  . lisinopril (PRINIVIL,ZESTRIL) 40 MG tablet Take 1 tablet (40 mg total) by mouth daily. 90 tablet 0  . Multiple Vitamins-Minerals (MULTIVITAMIN WITH MINERALS) tablet Take 1 tablet by mouth daily.    . potassium chloride SA (K-DUR,KLOR-CON) 20 MEQ tablet Take 1 tablet (20 mEq total) by mouth daily. 90 tablet 1  . vitamin C (ASCORBIC ACID) 500 MG tablet Take 500 mg by mouth daily.     No current facility-administered medications on file prior to visit.      Review of Systems See HPI    Objective:   Physical Exam Physical Exam  Nursing note and vitals reviewed.  Constitutional: She is oriented to person, place, and time. She appears well-developed and well-nourished.  HENT:  Head: Normocephalic and atraumatic.  Cardiovascular: Normal rate and regular rhythm. Exam reveals no gallop and no friction rub.  No murmur heard.  Pulmonary/Chest: Breath sounds normal. She has no wheezes. She has no rales.  Neurological: She is alert and oriented to person, place, and time.  Skin: Skin is warm and dry.  Psychiatric: She has a normal mood and affect. Her behavior is normal.              Assessment & Plan:  HTN:     Will D/C  hctz and start Lasix 20 mg with other meds.  Advised to be sure to take K daily  Osteoporosis  She has been on Boniva for past 5-6 years  Advised to stop and take drug holiday.  Check Dexa when due   Se eme in 3 weeks

## 2014-11-08 ENCOUNTER — Encounter: Payer: Self-pay | Admitting: Internal Medicine

## 2014-11-08 ENCOUNTER — Other Ambulatory Visit: Payer: Self-pay | Admitting: *Deleted

## 2014-11-08 ENCOUNTER — Ambulatory Visit (INDEPENDENT_AMBULATORY_CARE_PROVIDER_SITE_OTHER): Payer: 59 | Admitting: Internal Medicine

## 2014-11-08 VITALS — BP 140/78 | HR 55 | Resp 16 | Ht 62.0 in | Wt 246.0 lb

## 2014-11-08 DIAGNOSIS — I10 Essential (primary) hypertension: Secondary | ICD-10-CM

## 2014-11-08 DIAGNOSIS — E876 Hypokalemia: Secondary | ICD-10-CM | POA: Diagnosis not present

## 2014-11-08 LAB — BASIC METABOLIC PANEL WITH GFR
BUN: 10 mg/dL (ref 6–23)
CO2: 23 meq/L (ref 19–32)
Calcium: 9.1 mg/dL (ref 8.4–10.5)
Chloride: 104 meq/L (ref 96–112)
Creat: 0.64 mg/dL (ref 0.50–1.10)
Glucose, Bld: 92 mg/dL (ref 70–99)
Potassium: 4.3 meq/L (ref 3.5–5.3)
Sodium: 142 meq/L (ref 135–145)

## 2014-11-08 NOTE — Progress Notes (Signed)
Subjective:    Patient ID: Veronica Simpson, female    DOB: [birth year], 69 y.o.   MRN: [id number]  HPI 10/18/2014 note Assessment & Plan:  HTN: Will D/C hctz and start Lasix 20 mg with other meds. Advised to be sure to take K daily  Osteoporosis She has been on Boniva for past 5-6 years Advised to stop and take drug holiday. Check Dexa when due   Se eme in 3 weeks       TODay  Veronica Simpson is here for follow up of above   She reports she is tolerating Lasix and is taking her K every day   No LE edema  She checks bp at work ([employer]) and sbp ranges 130-140's per her report  No Known Allergies Past Medical History  Diagnosis Date  . Thyroid disease   . Hyperlipidemia   . Hypertension   . Depression   . Osteopenia    Past Surgical History  Procedure Laterality Date  . Cholecystectomy    . Abdominal hysterectomy    . Wrist surgery Left   . Tubal ligation    . Gstric bypass     History   Social History  . Marital Status: Married    Spouse Name: N/A  . Number of Children: N/A  . Years of Education: N/A   Occupational History  . Not on file.   Social History Main Topics  . Smoking status: Former Smoker    Quit date: 04/01/1977  . Smokeless tobacco: Never Used  . Alcohol Use: No  . Drug Use: No  . Sexual Activity: Yes    Birth Control/ Protection: Post-menopausal   Other Topics Concern  . Not on file   Social History Narrative   Family History  Problem Relation Age of Onset  . Depression Mother   . Arthritis Mother   . Dementia Mother   . Hyperlipidemia Mother   . Hypertension Mother   . Cancer Father     lymphoma  . Depression Father   . Diabetes Father   . Arthritis Father   . Cancer Sister     stomach  . Cancer Brother     prostate  . Cancer Maternal Grandmother     stomach  . Heart disease Maternal Grandfather   . Cancer Paternal Grandmother     multiple myloma  . Heart disease Paternal Grandfather    Patient Active  Problem List   Diagnosis Date Noted  . Weight loss 05/04/2014  . Sinus bradycardia by electrocardiogram 09/08/2013  . Atrophic vaginitis 09/08/2013  . History of gastric stapling 09/08/2013  . 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 Osteoporosis 05/09/2013  . History of TIA (transient ischemic attack) 05/09/2013  . Urinary urgency 04/12/2013  . Vaginal dryness 04/12/2013  . HTN (hypertension) 04/01/2013  . Hyperlipidemia 04/01/2013  . Hypothyroidism 04/01/2013  . Dyspareunia 04/01/2013  . Depression 04/01/2013  . S/P total hysterectomy and bilateral salpingo-oophorectomy 04/01/2013  . Menopause 04/01/2013  . History of sleep apnea 04/01/2013  . Edema, lower extremity 04/01/2013  . Osteopenia    Current Outpatient Prescriptions on File Prior to Visit  Medication Sig Dispense Refill  . aspirin 325 MG tablet Take 325 mg by mouth daily.    Marland Kitchen atenolol (TENORMIN) 50 MG tablet TAKE ONE TABLET BY MOUTH ONCE DAILY 90 tablet 1  . atorvastatin (LIPITOR) 40 MG tablet TAKE ONE TABLET BY MOUTH DAILY 90 tablet 1  . Cholecalciferol (VITAMIN D-3) 1000 UNITS CAPS Take 1 capsule  by mouth daily.    Marland Kitchen co-enzyme Q-10 30 MG capsule Take 30 mg by mouth daily.    Marland Kitchen FLUoxetine (PROZAC) 40 MG capsule TAKE 1 CAPSULE (40 MG TOTAL) BY MOUTH DAILY. 90 capsule 1  . furosemide (LASIX) 20 MG tablet Take 1 tablet (20 mg total) by mouth daily. 90 tablet 1  . Glucosamine-Chondroitin (MOVE FREE PO) Take by mouth.    Marland Kitchen ibuprofen (ADVIL,MOTRIN) 800 MG tablet Take one every 8 hours with food 30 tablet 1  . levothyroxine (SYNTHROID, LEVOTHROID) 88 MCG tablet TAKE ONE TABLET BY MOUTH ONCE DAILY BEFORE  BREAKFAST 90 tablet 2  . lisinopril (PRINIVIL,ZESTRIL) 40 MG tablet Take 1 tablet (40 mg total) by mouth daily. 90 tablet 0  . Multiple Vitamins-Minerals (MULTIVITAMIN WITH MINERALS) tablet Take 1 tablet by mouth daily.    . potassium chloride SA (K-DUR,KLOR-CON) 20 MEQ tablet Take 1 tablet (20 mEq total) by mouth daily. 90 tablet  1  . vitamin C (ASCORBIC ACID) 500 MG tablet Take 500 mg by mouth daily.     No current facility-administered medications on file prior to visit.       Review of Systems    see HPI Objective:   Physical Exam  Physical Exam  Nursing note and vitals reviewed.  Constitutional: She is oriented to person, place, and time. She appears well-developed and well-nourished.  HENT:  Head: Normocephalic and atraumatic.  Cardiovascular: Normal rate and regular rhythm. Exam reveals no gallop and no friction rub.  No murmur heard.  Pulmonary/Chest: Breath sounds normal. She has no wheezes. She has no rales.  Neurological: She is alert and oriented to person, place, and time.  Skin: Skin is warm and dry.  Ext  No edema Psychiatric: She has a normal mood and affect. Her behavior is normal.             Assessment & Plan:  HTN  Adequate control  Continue current meds  Minimally low K   Continue daily K    Will check BMP today  Pt informed of my departure and given letter with alternative PCP options

## 2014-11-08 NOTE — Telephone Encounter (Signed)
Refill request

## 2014-11-09 ENCOUNTER — Telehealth: Payer: Self-pay | Admitting: *Deleted

## 2014-11-09 MED ORDER — LEVOTHYROXINE SODIUM 88 MCG PO TABS: ORAL_TABLET | ORAL | Status: AC

## 2014-11-09 MED ORDER — FLUOXETINE HCL 40 MG PO CAPS: ORAL_CAPSULE | ORAL | Status: AC

## 2014-11-09 NOTE — Telephone Encounter (Signed)
Left patient a message in regards to her labs. I also mailed a copy to her-eh

## 2014-11-09 NOTE — Telephone Encounter (Signed)
-----   Message from Lanice Shirts, MD sent at 11/09/2014  7:19 AM EDT ----- Call pt and let her know her potassium is fine  Ok to mail to her

## 2014-12-12 ENCOUNTER — Other Ambulatory Visit: Payer: Self-pay | Admitting: Internal Medicine

## 2014-12-28 ENCOUNTER — Encounter: Payer: Self-pay | Admitting: *Deleted

## 2018-07-07 ENCOUNTER — Other Ambulatory Visit: Payer: Self-pay

## 2018-07-07 ENCOUNTER — Observation Stay (HOSPITAL_BASED_OUTPATIENT_CLINIC_OR_DEPARTMENT_OTHER)
Admission: EM | Admit: 2018-07-07 | Discharge: 2018-07-09 | Disposition: A | Discharge status: Home / Self Care | Payer: Medicare Other | Attending: General Surgery | Admitting: General Surgery

## 2018-07-07 ENCOUNTER — Encounter (HOSPITAL_BASED_OUTPATIENT_CLINIC_OR_DEPARTMENT_OTHER): Payer: Self-pay

## 2018-07-07 ENCOUNTER — Emergency Department (HOSPITAL_BASED_OUTPATIENT_CLINIC_OR_DEPARTMENT_OTHER): Payer: Medicare Other

## 2018-07-07 DIAGNOSIS — Z9884 Bariatric surgery status: Secondary | ICD-10-CM | POA: Diagnosis not present

## 2018-07-07 DIAGNOSIS — S01551A Open bite of lip, initial encounter: Secondary | ICD-10-CM | POA: Diagnosis not present

## 2018-07-07 DIAGNOSIS — E785 Hyperlipidemia, unspecified: Secondary | ICD-10-CM | POA: Diagnosis not present

## 2018-07-07 DIAGNOSIS — S2243XA Multiple fractures of ribs, bilateral, initial encounter for closed fracture: Secondary | ICD-10-CM | POA: Diagnosis present

## 2018-07-07 DIAGNOSIS — Z8673 Personal history of transient ischemic attack (TIA), and cerebral infarction without residual deficits: Secondary | ICD-10-CM | POA: Diagnosis not present

## 2018-07-07 DIAGNOSIS — M25571 Pain in right ankle and joints of right foot: Secondary | ICD-10-CM | POA: Diagnosis not present

## 2018-07-07 DIAGNOSIS — F329 Major depressive disorder, single episode, unspecified: Secondary | ICD-10-CM | POA: Diagnosis not present

## 2018-07-07 DIAGNOSIS — M4184 Other forms of scoliosis, thoracic region: Secondary | ICD-10-CM | POA: Insufficient documentation

## 2018-07-07 DIAGNOSIS — Z8249 Family history of ischemic heart disease and other diseases of the circulatory system: Secondary | ICD-10-CM | POA: Diagnosis not present

## 2018-07-07 DIAGNOSIS — S2249XA Multiple fractures of ribs, unspecified side, initial encounter for closed fracture: Secondary | ICD-10-CM | POA: Diagnosis present

## 2018-07-07 DIAGNOSIS — Z79899 Other long term (current) drug therapy: Secondary | ICD-10-CM | POA: Insufficient documentation

## 2018-07-07 DIAGNOSIS — W0110XA Fall on same level from slipping, tripping and stumbling with subsequent striking against unspecified object, initial encounter: Secondary | ICD-10-CM | POA: Diagnosis not present

## 2018-07-07 DIAGNOSIS — I10 Essential (primary) hypertension: Secondary | ICD-10-CM | POA: Diagnosis not present

## 2018-07-07 DIAGNOSIS — R51 Headache: Secondary | ICD-10-CM | POA: Insufficient documentation

## 2018-07-07 DIAGNOSIS — M79674 Pain in right toe(s): Secondary | ICD-10-CM

## 2018-07-07 DIAGNOSIS — M47814 Spondylosis without myelopathy or radiculopathy, thoracic region: Secondary | ICD-10-CM | POA: Insufficient documentation

## 2018-07-07 DIAGNOSIS — E042 Nontoxic multinodular goiter: Secondary | ICD-10-CM | POA: Diagnosis not present

## 2018-07-07 DIAGNOSIS — I700 Atherosclerosis of aorta: Secondary | ICD-10-CM | POA: Insufficient documentation

## 2018-07-07 DIAGNOSIS — M8580 Other specified disorders of bone density and structure, unspecified site: Secondary | ICD-10-CM | POA: Diagnosis not present

## 2018-07-07 DIAGNOSIS — I2510 Atherosclerotic heart disease of native coronary artery without angina pectoris: Secondary | ICD-10-CM | POA: Insufficient documentation

## 2018-07-07 DIAGNOSIS — J320 Chronic maxillary sinusitis: Secondary | ICD-10-CM | POA: Insufficient documentation

## 2018-07-07 DIAGNOSIS — K439 Ventral hernia without obstruction or gangrene: Secondary | ICD-10-CM | POA: Diagnosis not present

## 2018-07-07 DIAGNOSIS — Z791 Long term (current) use of non-steroidal anti-inflammatories (NSAID): Secondary | ICD-10-CM | POA: Insufficient documentation

## 2018-07-07 DIAGNOSIS — E039 Hypothyroidism, unspecified: Secondary | ICD-10-CM | POA: Diagnosis not present

## 2018-07-07 DIAGNOSIS — J9811 Atelectasis: Secondary | ICD-10-CM | POA: Diagnosis not present

## 2018-07-07 DIAGNOSIS — D3501 Benign neoplasm of right adrenal gland: Secondary | ICD-10-CM | POA: Diagnosis not present

## 2018-07-07 DIAGNOSIS — Z87891 Personal history of nicotine dependence: Secondary | ICD-10-CM | POA: Insufficient documentation

## 2018-07-07 DIAGNOSIS — D1771 Benign lipomatous neoplasm of kidney: Secondary | ICD-10-CM | POA: Diagnosis not present

## 2018-07-07 HISTORY — DX: Unspecified asthma, uncomplicated: J45.909

## 2018-07-07 HISTORY — DX: Cerebral infarction, unspecified: I63.9

## 2018-07-07 LAB — CBC WITH DIFFERENTIAL/PLATELET
Abs Immature Granulocytes: 0.12 10*3/uL — ABNORMAL HIGH (ref 0.00–0.07)
BASOS PCT: 0 %
Basophils Absolute: 0.1 10*3/uL (ref 0.0–0.1)
Eosinophils Absolute: 0.2 10*3/uL (ref 0.0–0.5)
Eosinophils Relative: 2 %
HCT: 45.6 % (ref 36.0–46.0)
Hemoglobin: 14.9 g/dL (ref 12.0–15.0)
Immature Granulocytes: 1 %
Lymphocytes Relative: 12 %
Lymphs Abs: 1.6 10*3/uL (ref 0.7–4.0)
MCH: 29.8 pg (ref 26.0–34.0)
MCHC: 32.7 g/dL (ref 30.0–36.0)
MCV: 91.2 fL (ref 80.0–100.0)
Monocytes Absolute: 1 10*3/uL (ref 0.1–1.0)
Monocytes Relative: 8 %
Neutro Abs: 9.8 10*3/uL — ABNORMAL HIGH (ref 1.7–7.7)
Neutrophils Relative %: 77 %
PLATELETS: 328 10*3/uL (ref 150–400)
RBC: 5 MIL/uL (ref 3.87–5.11)
RDW: 13.2 % (ref 11.5–15.5)
WBC: 12.7 10*3/uL — ABNORMAL HIGH (ref 4.0–10.5)
nRBC: 0 % (ref 0.0–0.2)

## 2018-07-07 LAB — LIPASE, BLOOD: Lipase: 41 U/L (ref 11–51)

## 2018-07-07 LAB — COMPREHENSIVE METABOLIC PANEL
ALT: 42 U/L (ref 0–44)
AST: 42 U/L — ABNORMAL HIGH (ref 15–41)
Albumin: 3.7 g/dL (ref 3.5–5.0)
Alkaline Phosphatase: 63 U/L (ref 38–126)
Anion gap: 10 (ref 5–15)
BUN: 15 mg/dL (ref 8–23)
CO2: 27 mmol/L (ref 22–32)
Calcium: 9 mg/dL (ref 8.9–10.3)
Chloride: 99 mmol/L (ref 98–111)
Creatinine, Ser: 0.73 mg/dL (ref 0.44–1.00)
GFR calc Af Amer: >60 mL/min (ref >60)
GFR calc non Af Amer: >60 mL/min (ref >60)
Glucose, Bld: 142 mg/dL — ABNORMAL HIGH (ref 70–99)
POTASSIUM: 3.3 mmol/L — AB (ref 3.5–5.1)
SODIUM: 136 mmol/L (ref 135–145)
Total Bilirubin: 2.4 mg/dL — ABNORMAL HIGH (ref 0.3–1.2)
Total Protein: 6.6 g/dL (ref 6.5–8.1)

## 2018-07-07 LAB — I-STAT CG4 LACTIC ACID, ED
Lactic Acid, Venous: 1.91 mmol/L — ABNORMAL HIGH (ref 0.5–1.9)
Lactic Acid, Venous: 1.64 mmol/L (ref 0.5–1.9)

## 2018-07-07 MED ORDER — MORPHINE SULFATE (PF) 4 MG/ML IV SOLN
4.0000 mg | Freq: Once | INTRAVENOUS | Status: AC
  Administered 2018-07-07: 4 mg via INTRAVENOUS
  Filled 2018-07-07: qty 1

## 2018-07-07 MED ORDER — IOPAMIDOL (ISOVUE-300) INJECTION 61%
100.0000 mL | Freq: Once | INTRAVENOUS | Status: AC | PRN
  Administered 2018-07-07: 100 mL via INTRAVENOUS

## 2018-07-07 NOTE — ED Provider Notes (Signed)
[facility] EMERGENCY DEPARTMENT Provider Note   CSN: [id number] Arrival date & time: 07/07/18  1902     History   Chief Complaint Chief Complaint  Patient presents with  . Fall    HPI Veronica Simpson is a 72 y.o. female.  The history is provided by the spouse and medical records. No language interpreter was used.  Fall  This is a new problem. The current episode started 1 to 2 hours ago. The problem occurs constantly. The problem has not changed since onset.Associated symptoms include chest pain, abdominal pain, headaches and shortness of breath. The symptoms are aggravated by coughing. Nothing relieves the symptoms. She has tried nothing for the symptoms. The treatment provided no relief.    Past Medical History:  Diagnosis Date  . Depression   . Hyperlipidemia   . Hypertension   . Osteopenia   . Stroke ([place])   . Thyroid disease     Patient Active Problem List   Diagnosis Date Noted  . Weight loss 05/04/2014  . Sinus bradycardia by electrocardiogram 09/08/2013  . Atrophic vaginitis 09/08/2013  . History of gastric stapling 09/08/2013  . 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 Osteoporosis 05/09/2013  . History of TIA (transient ischemic attack) 05/09/2013  . Urinary urgency 04/12/2013  . Vaginal dryness 04/12/2013  . HTN (hypertension) 04/01/2013  . Hyperlipidemia 04/01/2013  . Hypothyroidism 04/01/2013  . Dyspareunia 04/01/2013  . Depression 04/01/2013  . S/P total hysterectomy and bilateral salpingo-oophorectomy 04/01/2013  . Menopause 04/01/2013  . History of sleep apnea 04/01/2013  . Edema, lower extremity 04/01/2013  . Osteopenia     Past Surgical History:  Procedure Laterality Date  . ABDOMINAL HYSTERECTOMY    . APPENDECTOMY    . CHOLECYSTECTOMY    . gstric bypass    . TUBAL LIGATION    . WRIST SURGERY Left      OB History    Gravida  1   Para      Term      Preterm      AB  1   Living  0     SAB      TAB      Ectopic        Multiple      Live Births               Home Medications    Prior to Admission medications   Medication Sig Start Date End Date Taking? Authorizing Provider  aspirin 325 MG tablet Take 325 mg by mouth daily.    [provider]  atenolol (TENORMIN) 50 MG tablet TAKE ONE TABLET BY MOUTH ONCE DAILY 07/12/14   Schoenhoff, Altamese Cabal, MD  atorvastatin (LIPITOR) 40 MG tablet TAKE ONE TABLET BY MOUTH DAILY 07/12/14   Schoenhoff, Altamese Cabal, MD  Cholecalciferol (VITAMIN D-3) 1000 UNITS CAPS Take 1 capsule by mouth daily.    [provider]  co-enzyme Q-10 30 MG capsule Take 30 mg by mouth daily.    [provider]  FLUoxetine (PROZAC) 40 MG capsule TAKE 1 CAPSULE (40 MG TOTAL) BY MOUTH DAILY. 11/09/14   Schoenhoff, Altamese Cabal, MD  furosemide (LASIX) 20 MG tablet Take 1 tablet (20 mg total) by mouth daily. 10/18/14   Schoenhoff, Altamese Cabal, MD  Glucosamine-Chondroitin (MOVE FREE PO) Take by mouth.    [provider]  ibuprofen (ADVIL,MOTRIN) 800 MG tablet Take one every 8 hours with food 12/01/13   Schoenhoff, Altamese Cabal, MD  levothyroxine (SYNTHROID, LEVOTHROID)  88 MCG tablet TAKE ONE TABLET BY MOUTH ONCE DAILY BEFORE  BREAKFAST 11/09/14   Schoenhoff, Altamese Cabal, MD  lisinopril (PRINIVIL,ZESTRIL) 40 MG tablet Take 1 tablet (40 mg total) by mouth daily. 09/20/14   Schoenhoff, Altamese Cabal, MD  lisinopril-hydrochlorothiazide (PRINZIDE,ZESTORETIC) 20-25 MG per tablet TAKE 1 TABLET BY MOUTH DAILY. 12/13/14   Schoenhoff, Altamese Cabal, MD  lisinopril-hydrochlorothiazide (PRINZIDE,ZESTORETIC) 20-25 MG per tablet TAKE 1 TABLET BY MOUTH DAILY. 12/13/14   Schoenhoff, Altamese Cabal, MD  lisinopril-hydrochlorothiazide (PRINZIDE,ZESTORETIC) 20-25 MG per tablet TAKE 1 TABLET BY MOUTH DAILY. 12/13/14   Schoenhoff, Altamese Cabal, MD  Multiple Vitamins-Minerals (MULTIVITAMIN WITH MINERALS) tablet Take 1 tablet by mouth daily.    [provider]  potassium chloride SA (K-DUR,KLOR-CON) 20  MEQ tablet Take 1 tablet (20 mEq total) by mouth daily. 10/18/14   Schoenhoff, Altamese Cabal, MD  vitamin C (ASCORBIC ACID) 500 MG tablet Take 500 mg by mouth daily.    [provider]    Family History Family History  Problem Relation Age of Onset  . Depression Mother   . Arthritis Mother   . Dementia Mother   . Hyperlipidemia Mother   . Hypertension Mother   . Cancer Father        lymphoma  . Depression Father   . Diabetes Father   . Arthritis Father   . Cancer Sister        stomach  . Cancer Brother        prostate  . Cancer Maternal Grandmother        stomach  . Heart disease Maternal Grandfather   . Cancer Paternal Grandmother        multiple myloma  . Heart disease Paternal Grandfather     Social History Social History   Tobacco Use  . Smoking status: Former Smoker    Last attempt to quit: 04/01/1977    Years since quitting: 41.2  . Smokeless tobacco: Never Used  Substance Use Topics  . Alcohol use: No  . Drug use: No     Allergies   Patient has no known allergies.   Review of Systems Review of Systems  Constitutional: Negative for chills, diaphoresis, fatigue and fever.  HENT: Positive for nosebleeds. Negative for congestion.   Eyes: Negative for visual disturbance.  Respiratory: Positive for chest tightness and shortness of breath. Negative for cough, wheezing and stridor.   Cardiovascular: Positive for chest pain. Negative for palpitations and leg swelling.  Gastrointestinal: Positive for abdominal pain. Negative for diarrhea, nausea and vomiting.  Genitourinary: Negative for flank pain.  Musculoskeletal: Negative for back pain, neck pain and neck stiffness.  Neurological: Positive for headaches. Negative for dizziness and light-headedness.  Psychiatric/Behavioral: Negative for agitation.  All other systems reviewed and are negative.    Physical Exam Updated Vital Signs BP (!) 124/100 (BP Location: Right Arm)   Pulse 64   Temp 97.9 F  (36.6 C) (Oral)   Resp 20   Ht 5\' 2"  (1.575 m)   Wt 115.7 kg   LMP 04/01/2013   SpO2 97%   BMI 46.64 kg/m   Physical Exam  Constitutional: She is oriented to person, place, and time. She appears well-developed and well-nourished. No distress.  HENT:  Head: Normocephalic and atraumatic.    Mouth/Throat: Oropharynx is clear and moist. No oropharyngeal exudate.  Tenderness and bruising of the nose.  No nasal septal hematoma.  No epistaxis seen.  Face is stable.  Normal extraocular movements.  Eyes: Pupils are equal, round,  and reactive to light. Conjunctivae and EOM are normal.  Neck: Normal range of motion. Neck supple.  Cardiovascular: Normal rate and regular rhythm.  No murmur heard. Pulmonary/Chest: Effort normal and breath sounds normal. Tachypnea noted. No respiratory distress. She has no wheezes. She has no rales. She exhibits tenderness.    Abdominal: Soft. Normal appearance. There is tenderness in the right upper quadrant, epigastric area and left upper quadrant.    Musculoskeletal: She exhibits tenderness. She exhibits no edema.  Neurological: She is alert and oriented to person, place, and time. No sensory deficit. She exhibits normal muscle tone.  Skin: Skin is warm and dry. Capillary refill takes less than 2 seconds. No rash noted. She is not diaphoretic. No erythema.  Psychiatric: She has a normal mood and affect.  Nursing note and vitals reviewed.    ED Treatments / Results  Labs (all labs ordered are listed, but only abnormal results are displayed) Labs Reviewed  CBC WITH DIFFERENTIAL/PLATELET - Abnormal; Notable for the following components:      Result Value   WBC 12.7 (*)    Neutro Abs 9.8 (*)    Abs Immature Granulocytes 0.12 (*)    All other components within normal limits  COMPREHENSIVE METABOLIC PANEL - Abnormal; Notable for the following components:   Potassium 3.3 (*)    Glucose, Bld 142 (*)    AST 42 (*)    Total Bilirubin 2.4 (*)    All other  components within normal limits  I-STAT CG4 LACTIC ACID, ED - Abnormal; Notable for the following components:   Lactic Acid, Venous 1.91 (*)    All other components within normal limits  LIPASE, BLOOD  URINALYSIS, ROUTINE W REFLEX MICROSCOPIC  I-STAT CG4 LACTIC ACID, ED    EKG None  Radiology Ct Head Wo Contrast  Result Date: 07/07/2018 CLINICAL DATA:  Frontal headache and bilateral left laceration after tripping and falling today. EXAM: CT HEAD WITHOUT CONTRAST CT MAXILLOFACIAL WITHOUT CONTRAST TECHNIQUE: Multidetector CT imaging of the head and maxillofacial structures were performed using the standard protocol without intravenous contrast. Multiplanar CT image reconstructions of the maxillofacial structures were also generated. COMPARISON:  Brain MR dated 04/25/2010 and head CT dated 04/25/2010. FINDINGS: CT HEAD FINDINGS Brain: Stable mildly enlarged ventricles and cortical sulci. Minimal patchy white matter low density in both cerebral hemispheres with mild progression. No intracranial hemorrhage, mass lesion or CT evidence of acute infarction. Vascular: No hyperdense vessel or unexpected calcification. Skull: Bilateral hyperostosis frontalis. No skull fracture. Sinuses/Orbits: Unremarkable. Other: None. CT MAXILLOFACIAL FINDINGS Osseous: No fracture or mandibular dislocation. No destructive process. Orbits: Negative. No traumatic or inflammatory finding. Sinuses: Mucosal thickening in the inferior right maxillary sinus. Soft tissues: Unremarkable. IMPRESSION: 1. No skull fracture or intracranial hemorrhage. 2. No maxillofacial fracture. 3. Mildly progressive minimal chronic small vessel white matter ischemic changes in both cerebral hemispheres. 4. Stable mild diffuse cerebral and cerebellar atrophy. 5. Mild chronic right maxillary sinusitis. Electronically Signed   By: Claudie Revering M.D.   On: 07/07/2018 22:18   Ct Chest W Contrast  Result Date: 07/07/2018 CLINICAL DATA:  Chest pain with  deep inspiration following a fall today. EXAM: CT CHEST, ABDOMEN, AND PELVIS WITH CONTRAST TECHNIQUE: Multidetector CT imaging of the chest, abdomen and pelvis was performed following the standard protocol during bolus administration of intravenous contrast. CONTRAST:  143mL ISOVUE-300 IOPAMIDOL (ISOVUE-300) INJECTION 61% COMPARISON:  Abdomen and pelvis CT dated 12/12/2007. Chest radiographs dated 10/29/2010. FINDINGS: CT CHEST FINDINGS Cardiovascular: Atheromatous calcifications, including  the coronary arteries and aorta. Normal sized heart. Mediastinum/Nodes: No enlarged lymph nodes. Diffusely enlarged and heterogeneous thyroid gland containing multiple calcified and noncalcified nodules. The largest nodule is on the left and is heterogeneous with a low density component and high density component and measures 4.1 cm in maximum diameter on image number 9 series 2. Lungs/Pleura: Mild left and minimal right dependent atelectasis. Left lower lobe calcified granuloma. No airspace consolidation or pleural fluid. Musculoskeletal: Focal angulation deformities of the anterolateral aspects of the 3rd, 4th and 5th ribs bilaterally and right 6th rib. There are small cortical step-offs involving the right 4th, 5th and 6th ribs. Thoracic spine degenerative changes and dextroconvex scoliosis. CT ABDOMEN PELVIS FINDINGS Hepatobiliary: Right lobe liver calcified granuloma. Cholecystectomy clips. Pancreas: Unremarkable. No pancreatic ductal dilatation or surrounding inflammatory changes. Spleen: Normal in size without focal abnormality. Adrenals/Urinary Tract: No significant change in an oval right adrenal mass measuring 1.6 x 1.3 cm on image number 52 series 2. Normal appearing left adrenal gland. Stable rounded fat density mass in the lower pole of the left kidney, measuring 2.9 cm in maximum diameter. This continues to have a minimal curvilinear soft tissue density component. Interval 1.8 cm similar appearing fat density mass  in the mid right kidney. Unremarkable urinary bladder and ureters. Stomach/Bowel: Post gastric bypass changes. Small bowel loops in a right lower anterolateral wide-mouth abdominal wall hernia without dilatation or wall thickening. Unremarkable colon. Surgically absent appendix. Vascular/Lymphatic: Mild atheromatous arterial calcifications. No enlarged lymph nodes. Reproductive: Status post hysterectomy. No adnexal masses. Other: Moderate-sized wide-mouth right anterolateral abdominal wall hernia containing herniated fat and small bowel loops. This is at the level of the upper pelvis. Musculoskeletal: Lumbar spine degenerative changes. No fractures, pars defects or subluxations. The pelvic bones are intact with a small left iliac bone island noted. IMPRESSION: 1. Essentially nondisplaced fractures of the anterolateral aspects of the 3rd, 4th and 5th ribs bilaterally and right 6th rib. 2. Stable right adrenal adenoma. 3. Stable left renal angiomyolipoma and interval small right renal angiomyolipoma. 4. Multinodular thyroid goiter with a dominant nodule on the left measuring 4.1 cm in maximum diameter. Consider further evaluation with thyroid ultrasound. If patient is clinically hyperthyroid, consider nuclear medicine thyroid uptake and scan. 5. Moderate-sized wide-mouth right anterolateral abdominal wall hernia containing herniated fat and small bowel loops without obstruction. 6.  Calcific coronary artery and aortic atherosclerosis. Aortic Atherosclerosis (ICD10-I70.0). Electronically Signed   By: Claudie Revering M.D.   On: 07/07/2018 22:43   Ct Abdomen Pelvis W Contrast  Result Date: 07/07/2018 CLINICAL DATA:  Chest pain with deep inspiration following a fall today. EXAM: CT CHEST, ABDOMEN, AND PELVIS WITH CONTRAST TECHNIQUE: Multidetector CT imaging of the chest, abdomen and pelvis was performed following the standard protocol during bolus administration of intravenous contrast. CONTRAST:  151mL ISOVUE-300  IOPAMIDOL (ISOVUE-300) INJECTION 61% COMPARISON:  Abdomen and pelvis CT dated 12/12/2007. Chest radiographs dated 10/29/2010. FINDINGS: CT CHEST FINDINGS Cardiovascular: Atheromatous calcifications, including the coronary arteries and aorta. Normal sized heart. Mediastinum/Nodes: No enlarged lymph nodes. Diffusely enlarged and heterogeneous thyroid gland containing multiple calcified and noncalcified nodules. The largest nodule is on the left and is heterogeneous with a low density component and high density component and measures 4.1 cm in maximum diameter on image number 9 series 2. Lungs/Pleura: Mild left and minimal right dependent atelectasis. Left lower lobe calcified granuloma. No airspace consolidation or pleural fluid. Musculoskeletal: Focal angulation deformities of the anterolateral aspects of the 3rd, 4th and 5th ribs bilaterally  and right 6th rib. There are small cortical step-offs involving the right 4th, 5th and 6th ribs. Thoracic spine degenerative changes and dextroconvex scoliosis. CT ABDOMEN PELVIS FINDINGS Hepatobiliary: Right lobe liver calcified granuloma. Cholecystectomy clips. Pancreas: Unremarkable. No pancreatic ductal dilatation or surrounding inflammatory changes. Spleen: Normal in size without focal abnormality. Adrenals/Urinary Tract: No significant change in an oval right adrenal mass measuring 1.6 x 1.3 cm on image number 52 series 2. Normal appearing left adrenal gland. Stable rounded fat density mass in the lower pole of the left kidney, measuring 2.9 cm in maximum diameter. This continues to have a minimal curvilinear soft tissue density component. Interval 1.8 cm similar appearing fat density mass in the mid right kidney. Unremarkable urinary bladder and ureters. Stomach/Bowel: Post gastric bypass changes. Small bowel loops in a right lower anterolateral wide-mouth abdominal wall hernia without dilatation or wall thickening. Unremarkable colon. Surgically absent appendix.  Vascular/Lymphatic: Mild atheromatous arterial calcifications. No enlarged lymph nodes. Reproductive: Status post hysterectomy. No adnexal masses. Other: Moderate-sized wide-mouth right anterolateral abdominal wall hernia containing herniated fat and small bowel loops. This is at the level of the upper pelvis. Musculoskeletal: Lumbar spine degenerative changes. No fractures, pars defects or subluxations. The pelvic bones are intact with a small left iliac bone island noted. IMPRESSION: 1. Essentially nondisplaced fractures of the anterolateral aspects of the 3rd, 4th and 5th ribs bilaterally and right 6th rib. 2. Stable right adrenal adenoma. 3. Stable left renal angiomyolipoma and interval small right renal angiomyolipoma. 4. Multinodular thyroid goiter with a dominant nodule on the left measuring 4.1 cm in maximum diameter. Consider further evaluation with thyroid ultrasound. If patient is clinically hyperthyroid, consider nuclear medicine thyroid uptake and scan. 5. Moderate-sized wide-mouth right anterolateral abdominal wall hernia containing herniated fat and small bowel loops without obstruction. 6.  Calcific coronary artery and aortic atherosclerosis. Aortic Atherosclerosis (ICD10-I70.0). Electronically Signed   By: Claudie Revering M.D.   On: 07/07/2018 22:43   Ct Maxillofacial Wo Contrast  Result Date: 07/07/2018 CLINICAL DATA:  Frontal headache and bilateral left laceration after tripping and falling today. EXAM: CT HEAD WITHOUT CONTRAST CT MAXILLOFACIAL WITHOUT CONTRAST TECHNIQUE: Multidetector CT imaging of the head and maxillofacial structures were performed using the standard protocol without intravenous contrast. Multiplanar CT image reconstructions of the maxillofacial structures were also generated. COMPARISON:  Brain MR dated 04/25/2010 and head CT dated 04/25/2010. FINDINGS: CT HEAD FINDINGS Brain: Stable mildly enlarged ventricles and cortical sulci. Minimal patchy white matter low density in  both cerebral hemispheres with mild progression. No intracranial hemorrhage, mass lesion or CT evidence of acute infarction. Vascular: No hyperdense vessel or unexpected calcification. Skull: Bilateral hyperostosis frontalis. No skull fracture. Sinuses/Orbits: Unremarkable. Other: None. CT MAXILLOFACIAL FINDINGS Osseous: No fracture or mandibular dislocation. No destructive process. Orbits: Negative. No traumatic or inflammatory finding. Sinuses: Mucosal thickening in the inferior right maxillary sinus. Soft tissues: Unremarkable. IMPRESSION: 1. No skull fracture or intracranial hemorrhage. 2. No maxillofacial fracture. 3. Mildly progressive minimal chronic small vessel white matter ischemic changes in both cerebral hemispheres. 4. Stable mild diffuse cerebral and cerebellar atrophy. 5. Mild chronic right maxillary sinusitis. Electronically Signed   By: Claudie Revering M.D.   On: 07/07/2018 22:18    Procedures Procedures (including critical care time)  Medications Ordered in ED Medications  morphine 4 MG/ML injection 4 mg (4 mg Intravenous Given 07/07/18 2058)  iopamidol (ISOVUE-300) 61 % injection 100 mL (100 mLs Intravenous Contrast Given 07/07/18 2205)  morphine 4 MG/ML injection 4 mg (4  mg Intravenous Given 07/07/18 2323)     Initial Impression / Assessment and Plan / ED Course  I have reviewed the triage vital signs and the nursing notes.  Pertinent labs & imaging results that were available during my care of the patient were reviewed by me and considered in my medical decision making (see chart for details).     Veronica Simpson is a 72 y.o. female with a past medical history significant for hypertension, hyperlipidemia, osteopenia, thyroid disease, and prior stroke who presents with a fall.  Patient reports that she was walking out of [facility] lobby carrying a foam board when she tripped on the curb falling face forward.  She reports hitting her chest abdomen and face onto the ground.  She did  not lose consciousness.  She reports having nosebleed, nose pain, and headache as well as significant chest and abdominal pain.  Patient went to an urgent care where she was seen and then sent here for evaluation and likely CT imaging.  She reports some mild shortness of breath but reports significant chest pain with any deep breathing.  She reports no nausea, vomiting, urinary symptoms or GI symptoms.  No vision changes, or neurologic deficits.  On exam, patient has significant tenderness across her chest and upper abdomen.  Lungs are clear bilaterally.  No murmur.  No focal neurologic deficit seen.  Patient has bruising and tenderness across her nose with no significant epistaxis seen no evidence of nasal septal hematoma.  Normal extraocular movements.  Neck is nontender.  Due to the concern for diffuse chest injury and upper abdominal injury, patient will have screen laboratory testing as well as CT of the chest, abdomen, pelvis, face, and head.  CT scans reveal 7 rib fractures.  Patient has required multiple doses of pain medicine.  No evidence of nasal fracture or intracranial injury.  Laboratory testing overall reassuring.  Spoke with Dr. Kieth Brightly trauma surgery who agreed with admission for further pain management and pulmonary monitoring given the new 7 rib fractures.  Patient received more morphine and will be admitted to trauma service at [facility]  Trauma requested patient be transferred ED to ED for evaluation to determine level of care she would need during admission.  Spoke with Dr. Sedonia Small in the emergency department who accepted her for ED to ED transfer.  Patient will be transferred before admission to trauma.   Final Clinical Impressions(s) / ED Diagnoses   Final diagnoses:  Closed fracture of multiple ribs of both sides, initial encounter    ED Discharge Orders    None     Clinical Impression: 1. Closed fracture of multiple ribs of both sides, initial encounter      Disposition: Admit  This note was prepared with assistance of Dragon voice recognition software. Occasional wrong-word or sound-a-like substitutions may have occurred due to the inherent limitations of voice recognition software.     Tegeler, Gwenyth Allegra, MD 07/08/18 [phone]

## 2018-07-07 NOTE — ED Triage Notes (Signed)
Pt tripped/fell ~4pm-pain to lip, nose, epigastric and chest-no LOC-states EMS was on scene-NAD-to triage in w/c-NAD

## 2018-07-08 ENCOUNTER — Encounter (HOSPITAL_COMMUNITY): Payer: Self-pay | Admitting: *Deleted

## 2018-07-08 ENCOUNTER — Observation Stay (HOSPITAL_COMMUNITY): Payer: Medicare Other

## 2018-07-08 DIAGNOSIS — S2249XA Multiple fractures of ribs, unspecified side, initial encounter for closed fracture: Secondary | ICD-10-CM | POA: Diagnosis present

## 2018-07-08 LAB — CBC
HCT: 41.7 % (ref 36.0–46.0)
Hemoglobin: 13.8 g/dL (ref 12.0–15.0)
MCH: 29.9 pg (ref 26.0–34.0)
MCHC: 33.1 g/dL (ref 30.0–36.0)
MCV: 90.3 fL (ref 80.0–100.0)
NRBC: 0 % (ref 0.0–0.2)
Platelets: 284 10*3/uL (ref 150–400)
RBC: 4.62 MIL/uL (ref 3.87–5.11)
RDW: 13.2 % (ref 11.5–15.5)
WBC: 8.2 10*3/uL (ref 4.0–10.5)

## 2018-07-08 LAB — BASIC METABOLIC PANEL
Anion gap: 13 (ref 5–15)
BUN: 13 mg/dL (ref 8–23)
CO2: 25 mmol/L (ref 22–32)
Calcium: 8.9 mg/dL (ref 8.9–10.3)
Chloride: 102 mmol/L (ref 98–111)
Creatinine, Ser: 0.79 mg/dL (ref 0.44–1.00)
GFR calc Af Amer: >60 mL/min (ref >60)
Glucose, Bld: 160 mg/dL — ABNORMAL HIGH (ref 70–99)
Potassium: 3.1 mmol/L — ABNORMAL LOW (ref 3.5–5.1)
Sodium: 140 mmol/L (ref 135–145)

## 2018-07-08 LAB — URINALYSIS, ROUTINE W REFLEX MICROSCOPIC
Bilirubin Urine: NEGATIVE
Glucose, UA: NEGATIVE mg/dL
Hgb urine dipstick: NEGATIVE
Ketones, ur: NEGATIVE mg/dL
Leukocytes, UA: NEGATIVE
Nitrite: NEGATIVE
PROTEIN: NEGATIVE mg/dL
Specific Gravity, Urine: >1.046 — ABNORMAL HIGH (ref 1.005–1.030)
pH: 5 (ref 5.0–8.0)

## 2018-07-08 LAB — HIV ANTIBODY (ROUTINE TESTING W REFLEX): HIV Screen 4th Generation wRfx: NONREACTIVE

## 2018-07-08 MED ORDER — ATORVASTATIN CALCIUM 40 MG PO TABS
40.0000 mg | ORAL_TABLET | ORAL | Status: DC
  Administered 2018-07-08 – 2018-07-09 (×2): 40 mg via ORAL
  Filled 2018-07-08 (×2): qty 1

## 2018-07-08 MED ORDER — IBUPROFEN 800 MG PO TABS: 800.0000 mg | ORAL_TABLET | Freq: Three times a day (TID) | ORAL | Status: DC | PRN

## 2018-07-08 MED ORDER — FLUOXETINE HCL 20 MG PO CAPS
40.0000 mg | ORAL_CAPSULE | Freq: Every day | ORAL | Status: DC
  Administered 2018-07-08 – 2018-07-09 (×2): 40 mg via ORAL
  Filled 2018-07-08 (×2): qty 2

## 2018-07-08 MED ORDER — LEVOTHYROXINE SODIUM 88 MCG PO TABS
88.0000 ug | ORAL_TABLET | Freq: Every day | ORAL | Status: DC
  Administered 2018-07-09: 88 ug via ORAL
  Filled 2018-07-08: qty 1

## 2018-07-08 MED ORDER — HYDROCHLOROTHIAZIDE 25 MG PO TABS
25.0000 mg | ORAL_TABLET | Freq: Every day | ORAL | Status: DC
  Administered 2018-07-08 – 2018-07-09 (×2): 25 mg via ORAL
  Filled 2018-07-08 (×2): qty 1

## 2018-07-08 MED ORDER — ONDANSETRON 4 MG PO TBDP: 4.0000 mg | ORAL_TABLET | Freq: Four times a day (QID) | ORAL | Status: DC | PRN

## 2018-07-08 MED ORDER — POTASSIUM CHLORIDE CRYS ER 20 MEQ PO TBCR
20.0000 meq | EXTENDED_RELEASE_TABLET | Freq: Two times a day (BID) | ORAL | Status: DC
  Administered 2018-07-08 – 2018-07-09 (×3): 20 meq via ORAL
  Filled 2018-07-08 (×3): qty 1

## 2018-07-08 MED ORDER — ALBUTEROL SULFATE (2.5 MG/3ML) 0.083% IN NEBU: 3.0000 mL | INHALATION_SOLUTION | Freq: Four times a day (QID) | RESPIRATORY_TRACT | Status: DC | PRN

## 2018-07-08 MED ORDER — ONDANSETRON HCL 4 MG/2ML IJ SOLN
4.0000 mg | Freq: Four times a day (QID) | INTRAMUSCULAR | Status: DC | PRN
  Administered 2018-07-08 (×2): 4 mg via INTRAVENOUS
  Filled 2018-07-08 (×2): qty 2

## 2018-07-08 MED ORDER — ADULT MULTIVITAMIN W/MINERALS CH
1.0000 | ORAL_TABLET | Freq: Every day | ORAL | Status: DC
  Administered 2018-07-08 – 2018-07-09 (×2): 1 via ORAL
  Filled 2018-07-08 (×5): qty 1

## 2018-07-08 MED ORDER — ACETAMINOPHEN 325 MG PO TABS: 650.0000 mg | ORAL_TABLET | ORAL | Status: DC | PRN

## 2018-07-08 MED ORDER — ACETAMINOPHEN 325 MG PO TABS
650.0000 mg | ORAL_TABLET | Freq: Four times a day (QID) | ORAL | Status: DC
  Administered 2018-07-08 – 2018-07-09 (×4): 650 mg via ORAL
  Filled 2018-07-08 (×4): qty 2

## 2018-07-08 MED ORDER — ATENOLOL 50 MG PO TABS
50.0000 mg | ORAL_TABLET | Freq: Every day | ORAL | Status: DC
  Administered 2018-07-08 – 2018-07-09 (×2): 50 mg via ORAL
  Filled 2018-07-08 (×2): qty 1

## 2018-07-08 MED ORDER — IBUPROFEN 600 MG PO TABS: 600.0000 mg | ORAL_TABLET | Freq: Four times a day (QID) | ORAL | Status: DC | PRN

## 2018-07-08 MED ORDER — MORPHINE SULFATE (PF) 2 MG/ML IV SOLN: 2.0000 mg | INTRAVENOUS | Status: DC | PRN

## 2018-07-08 MED ORDER — METOPROLOL TARTRATE 5 MG/5ML IV SOLN: 5.0000 mg | Freq: Four times a day (QID) | INTRAVENOUS | Status: DC | PRN

## 2018-07-08 MED ORDER — VITAMIN D 25 MCG (1000 UNIT) PO TABS
ORAL_TABLET | Freq: Every day | ORAL | Status: DC
  Administered 2018-07-08 – 2018-07-09 (×2): 1000 [IU] via ORAL
  Filled 2018-07-08 (×2): qty 1

## 2018-07-08 MED ORDER — ENOXAPARIN SODIUM 40 MG/0.4ML ~~LOC~~ SOLN
40.0000 mg | SUBCUTANEOUS | Status: DC
  Administered 2018-07-08 – 2018-07-09 (×2): 40 mg via SUBCUTANEOUS
  Filled 2018-07-08 (×2): qty 0.4

## 2018-07-08 MED ORDER — LISINOPRIL 40 MG PO TABS
40.0000 mg | ORAL_TABLET | Freq: Every day | ORAL | Status: DC
  Administered 2018-07-08 – 2018-07-09 (×2): 40 mg via ORAL
  Filled 2018-07-08 (×2): qty 1

## 2018-07-08 MED ORDER — OXYCODONE HCL 5 MG PO TABS
5.0000 mg | ORAL_TABLET | ORAL | Status: DC | PRN
  Administered 2018-07-08: 5 mg via ORAL
  Filled 2018-07-08: qty 1

## 2018-07-08 MED ORDER — DOCUSATE SODIUM 100 MG PO CAPS
100.0000 mg | ORAL_CAPSULE | Freq: Two times a day (BID) | ORAL | Status: DC
  Administered 2018-07-08 – 2018-07-09 (×3): 100 mg via ORAL
  Filled 2018-07-08 (×3): qty 1

## 2018-07-08 MED ORDER — METHOCARBAMOL 500 MG PO TABS
500.0000 mg | ORAL_TABLET | Freq: Three times a day (TID) | ORAL | Status: DC | PRN
  Administered 2018-07-08: 500 mg via ORAL
  Filled 2018-07-08: qty 1

## 2018-07-08 MED ORDER — OXYCODONE HCL 5 MG PO TABS
5.0000 mg | ORAL_TABLET | ORAL | Status: DC | PRN
  Administered 2018-07-08 (×2): 5 mg via ORAL
  Filled 2018-07-08 (×2): qty 1

## 2018-07-08 MED ORDER — VITAMIN C 500 MG PO TABS
500.0000 mg | ORAL_TABLET | Freq: Every day | ORAL | Status: DC
  Administered 2018-07-08 – 2018-07-09 (×2): 500 mg via ORAL
  Filled 2018-07-08 (×2): qty 1

## 2018-07-08 NOTE — ED Notes (Signed)
Assumed care on pt. , pt. transferred from [facility] , fell this afternoon and sustained ribs FX , alert and oriented at arrival rates pain 6/10 at bilateral ribcage pain , respirations unlabored . IV site intact . Patient placed on a monitor and pulse oximetry , warm blankets provided /repositioned on bed for comfort .

## 2018-07-08 NOTE — ED Provider Notes (Signed)
72 year old female received as a transfer from Dr. Sherry Ruffing at [facility] for trauma surgery consult.  Per his HPI:  "Fall  This is a new problem. The current episode started 1 to 2 hours ago. The problem occurs constantly. The problem has not changed since onset.Associated symptoms include chest pain, abdominal pain, headaches and shortness of breath. The symptoms are aggravated by coughing. Nothing relieves the symptoms. She has tried nothing for the symptoms. The treatment provided no relief."  Physical Exam  BP 118/74   Pulse 72   Temp 97.9 F (36.6 C) (Oral)   Resp 18   Ht 5\' 2"  (1.575 m)   Wt 115.7 kg   LMP 04/01/2013   SpO2 92%   BMI 46.64 kg/m   Physical Exam  Appears uncomfortable, but no acute distress..  Chest expansion is symmetric with inspiration.   ED Course/Procedures     Procedures  MDM  71 year old female received as a transfer from Dr. Sherry Ruffing at [facility].  She was transferred for evaluation by the trauma service after she was found to have 7 rib fractures.  No flail chest or displacement.  Consulted trauma surgery and Dr. Kieth Brightly will admit. The patient appears reasonably stabilized for admission considering the current resources, flow, and capabilities available in the ED at this time, and I doubt any other [facility] requiring further screening and/or treatment in the ED prior to admission.   Joline Maxcy A, PA-C 07/08/18 2979    Varney Biles, MD 07/08/18 2325

## 2018-07-08 NOTE — Progress Notes (Signed)
[facility] Progress Note     Subjective: CC-  Patient just arrived to room on floor from ED. Overall feeling well. Having some mild pain from rib fractures, but states that she just took an oxycodone which helps. Denies SOB on room air. Pulling 1750 on IS. Right great toe is sore.  Lives at home with husband. Ambulates without assistive device.  Objective: Vital signs in last 24 hours: Temp:  [97.9 F (36.6 C)-98.6 F (37 C)] 98.6 F (37 C) (12/11 0954) Pulse Rate:  [64-74] 74 (12/11 0954) Resp:  [16-22] 16 (12/11 0954) BP: (118-142)/(66-100) 142/88 (12/11 0954) SpO2:  [91 %-97 %] 95 % (12/11 0954) Weight:  [115.7 kg] 115.7 kg (12/10 1917)    Intake/Output from previous day: No intake/output data recorded. Intake/Output this shift: Total I/O In: 240 [P.O.:240] Out: -   PE: Gen:  Alert, NAD, pleasant HEENT: EOM's intact, pupils equal and round Card:  RRR Pulm:  CTAB, no W/R/R, effort normal, pulling 1750 on IS Ext: R great toe with mild edema and ecchymosis, TTP distal end of toe Psych: A&Ox3  Skin: no rashes noted, warm and dry  Lab Results:  Recent Labs    07/07/18 2054 07/08/18 0313  WBC 12.7* 8.2  HGB 14.9 13.8  HCT 45.6 41.7  PLT 328 284   BMET Recent Labs    07/07/18 2054 07/08/18 0313  NA 136 140  K 3.3* 3.1*  CL 99 102  CO2 27 25  GLUCOSE 142* 160*  BUN 15 13  CREATININE 0.73 0.79  CALCIUM 9.0 8.9   PT/INR No results for input(s): LABPROT, INR in the last 72 hours. CMP     Component Value Date/Time   NA 140 07/08/2018 0313   K 3.1 (L) 07/08/2018 0313   CL 102 07/08/2018 0313   CO2 25 07/08/2018 0313   GLUCOSE 160 (H) 07/08/2018 0313   BUN 13 07/08/2018 0313   CREATININE 0.79 07/08/2018 0313   CREATININE 0.64 11/08/2014 0945   CALCIUM 8.9 07/08/2018 0313   PROT 6.6 07/07/2018 2054   ALBUMIN 3.7 07/07/2018 2054   AST 42 (H) 07/07/2018 2054   ALT 42 07/07/2018 2054   ALKPHOS 63 07/07/2018 2054   BILITOT 2.4 (H)  07/07/2018 2054   GFRNONAA >60 07/08/2018 0313   GFRAA >60 07/08/2018 0313   Lipase     Component Value Date/Time   LIPASE 41 07/07/2018 2054       Studies/Results: Ct Head Wo Contrast  Result Date: 07/07/2018 CLINICAL DATA:  Frontal headache and bilateral left laceration after tripping and falling today. EXAM: CT HEAD WITHOUT CONTRAST CT MAXILLOFACIAL WITHOUT CONTRAST TECHNIQUE: Multidetector CT imaging of the head and maxillofacial structures were performed using the standard protocol without intravenous contrast. Multiplanar CT image reconstructions of the maxillofacial structures were also generated. COMPARISON:  Brain MR dated 04/25/2010 and head CT dated 04/25/2010. FINDINGS: CT HEAD FINDINGS Brain: Stable mildly enlarged ventricles and cortical sulci. Minimal patchy white matter low density in both cerebral hemispheres with mild progression. No intracranial hemorrhage, mass lesion or CT evidence of acute infarction. Vascular: No hyperdense vessel or unexpected calcification. Skull: Bilateral hyperostosis frontalis. No skull fracture. Sinuses/Orbits: Unremarkable. Other: None. CT MAXILLOFACIAL FINDINGS Osseous: No fracture or mandibular dislocation. No destructive process. Orbits: Negative. No traumatic or inflammatory finding. Sinuses: Mucosal thickening in the inferior right maxillary sinus. Soft tissues: Unremarkable. IMPRESSION: 1. No skull fracture or intracranial hemorrhage. 2. No maxillofacial fracture. 3. Mildly progressive minimal chronic small vessel white matter  ischemic changes in both cerebral hemispheres. 4. Stable mild diffuse cerebral and cerebellar atrophy. 5. Mild chronic right maxillary sinusitis. Electronically Signed   By: Claudie Revering M.D.   On: 07/07/2018 22:18   Ct Chest W Contrast  Result Date: 07/07/2018 CLINICAL DATA:  Chest pain with deep inspiration following a fall today. EXAM: CT CHEST, ABDOMEN, AND PELVIS WITH CONTRAST TECHNIQUE: Multidetector CT imaging of  the chest, abdomen and pelvis was performed following the standard protocol during bolus administration of intravenous contrast. CONTRAST:  183mL ISOVUE-300 IOPAMIDOL (ISOVUE-300) INJECTION 61% COMPARISON:  Abdomen and pelvis CT dated 12/12/2007. Chest radiographs dated 10/29/2010. FINDINGS: CT CHEST FINDINGS Cardiovascular: Atheromatous calcifications, including the coronary arteries and aorta. Normal sized heart. Mediastinum/Nodes: No enlarged lymph nodes. Diffusely enlarged and heterogeneous thyroid gland containing multiple calcified and noncalcified nodules. The largest nodule is on the left and is heterogeneous with a low density component and high density component and measures 4.1 cm in maximum diameter on image number 9 series 2. Lungs/Pleura: Mild left and minimal right dependent atelectasis. Left lower lobe calcified granuloma. No airspace consolidation or pleural fluid. Musculoskeletal: Focal angulation deformities of the anterolateral aspects of the 3rd, 4th and 5th ribs bilaterally and right 6th rib. There are small cortical step-offs involving the right 4th, 5th and 6th ribs. Thoracic spine degenerative changes and dextroconvex scoliosis. CT ABDOMEN PELVIS FINDINGS Hepatobiliary: Right lobe liver calcified granuloma. Cholecystectomy clips. Pancreas: Unremarkable. No pancreatic ductal dilatation or surrounding inflammatory changes. Spleen: Normal in size without focal abnormality. Adrenals/Urinary Tract: No significant change in an oval right adrenal mass measuring 1.6 x 1.3 cm on image number 52 series 2. Normal appearing left adrenal gland. Stable rounded fat density mass in the lower pole of the left kidney, measuring 2.9 cm in maximum diameter. This continues to have a minimal curvilinear soft tissue density component. Interval 1.8 cm similar appearing fat density mass in the mid right kidney. Unremarkable urinary bladder and ureters. Stomach/Bowel: Post gastric bypass changes. Small bowel loops in  a right lower anterolateral wide-mouth abdominal wall hernia without dilatation or wall thickening. Unremarkable colon. Surgically absent appendix. Vascular/Lymphatic: Mild atheromatous arterial calcifications. No enlarged lymph nodes. Reproductive: Status post hysterectomy. No adnexal masses. Other: Moderate-sized wide-mouth right anterolateral abdominal wall hernia containing herniated fat and small bowel loops. This is at the level of the upper pelvis. Musculoskeletal: Lumbar spine degenerative changes. No fractures, pars defects or subluxations. The pelvic bones are intact with a small left iliac bone island noted. IMPRESSION: 1. Essentially nondisplaced fractures of the anterolateral aspects of the 3rd, 4th and 5th ribs bilaterally and right 6th rib. 2. Stable right adrenal adenoma. 3. Stable left renal angiomyolipoma and interval small right renal angiomyolipoma. 4. Multinodular thyroid goiter with a dominant nodule on the left measuring 4.1 cm in maximum diameter. Consider further evaluation with thyroid ultrasound. If patient is clinically hyperthyroid, consider nuclear medicine thyroid uptake and scan. 5. Moderate-sized wide-mouth right anterolateral abdominal wall hernia containing herniated fat and small bowel loops without obstruction. 6.  Calcific coronary artery and aortic atherosclerosis. Aortic Atherosclerosis (ICD10-I70.0). Electronically Signed   By: Claudie Revering M.D.   On: 07/07/2018 22:43   Ct Abdomen Pelvis W Contrast  Result Date: 07/07/2018 CLINICAL DATA:  Chest pain with deep inspiration following a fall today. EXAM: CT CHEST, ABDOMEN, AND PELVIS WITH CONTRAST TECHNIQUE: Multidetector CT imaging of the chest, abdomen and pelvis was performed following the standard protocol during bolus administration of intravenous contrast. CONTRAST:  159mL ISOVUE-300 IOPAMIDOL (ISOVUE-300)  INJECTION 61% COMPARISON:  Abdomen and pelvis CT dated 12/12/2007. Chest radiographs dated 10/29/2010. FINDINGS:  CT CHEST FINDINGS Cardiovascular: Atheromatous calcifications, including the coronary arteries and aorta. Normal sized heart. Mediastinum/Nodes: No enlarged lymph nodes. Diffusely enlarged and heterogeneous thyroid gland containing multiple calcified and noncalcified nodules. The largest nodule is on the left and is heterogeneous with a low density component and high density component and measures 4.1 cm in maximum diameter on image number 9 series 2. Lungs/Pleura: Mild left and minimal right dependent atelectasis. Left lower lobe calcified granuloma. No airspace consolidation or pleural fluid. Musculoskeletal: Focal angulation deformities of the anterolateral aspects of the 3rd, 4th and 5th ribs bilaterally and right 6th rib. There are small cortical step-offs involving the right 4th, 5th and 6th ribs. Thoracic spine degenerative changes and dextroconvex scoliosis. CT ABDOMEN PELVIS FINDINGS Hepatobiliary: Right lobe liver calcified granuloma. Cholecystectomy clips. Pancreas: Unremarkable. No pancreatic ductal dilatation or surrounding inflammatory changes. Spleen: Normal in size without focal abnormality. Adrenals/Urinary Tract: No significant change in an oval right adrenal mass measuring 1.6 x 1.3 cm on image number 52 series 2. Normal appearing left adrenal gland. Stable rounded fat density mass in the lower pole of the left kidney, measuring 2.9 cm in maximum diameter. This continues to have a minimal curvilinear soft tissue density component. Interval 1.8 cm similar appearing fat density mass in the mid right kidney. Unremarkable urinary bladder and ureters. Stomach/Bowel: Post gastric bypass changes. Small bowel loops in a right lower anterolateral wide-mouth abdominal wall hernia without dilatation or wall thickening. Unremarkable colon. Surgically absent appendix. Vascular/Lymphatic: Mild atheromatous arterial calcifications. No enlarged lymph nodes. Reproductive: Status post hysterectomy. No adnexal  masses. Other: Moderate-sized wide-mouth right anterolateral abdominal wall hernia containing herniated fat and small bowel loops. This is at the level of the upper pelvis. Musculoskeletal: Lumbar spine degenerative changes. No fractures, pars defects or subluxations. The pelvic bones are intact with a small left iliac bone island noted. IMPRESSION: 1. Essentially nondisplaced fractures of the anterolateral aspects of the 3rd, 4th and 5th ribs bilaterally and right 6th rib. 2. Stable right adrenal adenoma. 3. Stable left renal angiomyolipoma and interval small right renal angiomyolipoma. 4. Multinodular thyroid goiter with a dominant nodule on the left measuring 4.1 cm in maximum diameter. Consider further evaluation with thyroid ultrasound. If patient is clinically hyperthyroid, consider nuclear medicine thyroid uptake and scan. 5. Moderate-sized wide-mouth right anterolateral abdominal wall hernia containing herniated fat and small bowel loops without obstruction. 6.  Calcific coronary artery and aortic atherosclerosis. Aortic Atherosclerosis (ICD10-I70.0). Electronically Signed   By: Claudie Revering M.D.   On: 07/07/2018 22:43   Ct Maxillofacial Wo Contrast  Result Date: 07/07/2018 CLINICAL DATA:  Frontal headache and bilateral left laceration after tripping and falling today. EXAM: CT HEAD WITHOUT CONTRAST CT MAXILLOFACIAL WITHOUT CONTRAST TECHNIQUE: Multidetector CT imaging of the head and maxillofacial structures were performed using the standard protocol without intravenous contrast. Multiplanar CT image reconstructions of the maxillofacial structures were also generated. COMPARISON:  Brain MR dated 04/25/2010 and head CT dated 04/25/2010. FINDINGS: CT HEAD FINDINGS Brain: Stable mildly enlarged ventricles and cortical sulci. Minimal patchy white matter low density in both cerebral hemispheres with mild progression. No intracranial hemorrhage, mass lesion or CT evidence of acute infarction. Vascular: No  hyperdense vessel or unexpected calcification. Skull: Bilateral hyperostosis frontalis. No skull fracture. Sinuses/Orbits: Unremarkable. Other: None. CT MAXILLOFACIAL FINDINGS Osseous: No fracture or mandibular dislocation. No destructive process. Orbits: Negative. No traumatic or inflammatory finding. Sinuses: Mucosal thickening  in the inferior right maxillary sinus. Soft tissues: Unremarkable. IMPRESSION: 1. No skull fracture or intracranial hemorrhage. 2. No maxillofacial fracture. 3. Mildly progressive minimal chronic small vessel white matter ischemic changes in both cerebral hemispheres. 4. Stable mild diffuse cerebral and cerebellar atrophy. 5. Mild chronic right maxillary sinusitis. Electronically Signed   By: Claudie Revering M.D.   On: 07/07/2018 22:18    Anti-infectives: Anti-infectives (From admission, onward)   None       Assessment/Plan Fall Multiple B rib fxs R 3-6 and L 3-5 - pain control and pulmonary toilet. Repeat CXR in AM R great toe pain - check xray HTN - home meds HLD - home meds Asthma - home albuterol PRN Depression - home prozac Hypothyroidism - home synthroid H/o CVA Abdominal wall hernia Thyroid nodule  ID - none FEN - CM diet VTE - SCDs, lovenox Foley - none  Plan - PT/OT. Schedule tylenol.   LOS: 0 days    Wellington Hampshire , [facility] 07/08/2018, 10:54 AM Pager: [phone] Mon 7:00 am -11:30 AM Tues-Fri 7:00 am-4:30 pm Sat-Sun 7:00 am-11:30 am

## 2018-07-08 NOTE — H&P (Signed)
Activation and Reason: consult, fall  Primary Survey: airway intact, breathing intact bilaterally, pulses intact  Veronica Simpson is an 72 y.o. female.  HPI: 72 yo female stubbed her toe and fell forward. She hit her face and nose and bit her lip. When she tried to get up she had a large amount of pain in her chest. The pain continues and is worse with deep breathing. It is bilateral chest areas. She takes a full aspirin for history of stroke.  Past Medical History:  Diagnosis Date  . Depression   . Hyperlipidemia   . Hypertension   . Osteopenia   . Stroke ([place])   . Thyroid disease     Past Surgical History:  Procedure Laterality Date  . ABDOMINAL HYSTERECTOMY    . APPENDECTOMY    . CHOLECYSTECTOMY    . gstric bypass    . TUBAL LIGATION    . WRIST SURGERY Left     Family History  Problem Relation Age of Onset  . Depression Mother   . Arthritis Mother   . Dementia Mother   . Hyperlipidemia Mother   . Hypertension Mother   . Cancer Father        lymphoma  . Depression Father   . Diabetes Father   . Arthritis Father   . Cancer Sister        stomach  . Cancer Brother        prostate  . Cancer Maternal Grandmother        stomach  . Heart disease Maternal Grandfather   . Cancer Paternal Grandmother        multiple myloma  . Heart disease Paternal Grandfather     Social History:  reports that she quit smoking about 41 years ago. She has never used smokeless tobacco. She reports that she does not drink alcohol or use drugs.  Allergies: No Known Allergies  Medications: I have reviewed the patient's current medications.  Results for orders placed or performed during the hospital encounter of 07/07/18 (from the past 48 hour(s))  CBC with Differential     Status: Abnormal   Collection Time: 07/07/18  8:54 PM  Result Value Ref Range   WBC 12.7 (H) 4.0 - 10.5 K/uL   RBC 5.00 3.87 - 5.11 MIL/uL   Hemoglobin 14.9 12.0 - 15.0 g/dL   HCT 45.6 36.0 - 46.0 %   MCV  91.2 80.0 - 100.0 fL   MCH 29.8 26.0 - 34.0 pg   MCHC 32.7 30.0 - 36.0 g/dL   RDW 13.2 11.5 - 15.5 %   Platelets 328 150 - 400 K/uL   nRBC 0.0 0.0 - 0.2 %   Neutrophils Relative % 77 %   Neutro Abs 9.8 (H) 1.7 - 7.7 K/uL   Lymphocytes Relative 12 %   Lymphs Abs 1.6 0.7 - 4.0 K/uL   Monocytes Relative 8 %   Monocytes Absolute 1.0 0.1 - 1.0 K/uL   Eosinophils Relative 2 %   Eosinophils Absolute 0.2 0.0 - 0.5 K/uL   Basophils Relative 0 %   Basophils Absolute 0.1 0.0 - 0.1 K/uL   Immature Granulocytes 1 %   Abs Immature Granulocytes 0.12 (H) 0.00 - 0.07 K/uL    Comment: Performed at [facility], [address] [place], [place] [address]  Comprehensive metabolic panel     Status: Abnormal   Collection Time: 07/07/18  8:54 PM  Result Value Ref Range   Sodium 136 135 -  145 mmol/L   Potassium 3.3 (L) 3.5 - 5.1 mmol/L   Chloride 99 98 - 111 mmol/L   CO2 27 22 - 32 mmol/L   Glucose, Bld 142 (H) 70 - 99 mg/dL   BUN 15 8 - 23 mg/dL   Creatinine, Ser 0.73 0.44 - 1.00 mg/dL   Calcium 9.0 8.9 - 10.3 mg/dL   Total Protein 6.6 6.5 - 8.1 g/dL   Albumin 3.7 3.5 - 5.0 g/dL   AST 42 (H) 15 - 41 U/L   ALT 42 0 - 44 U/L   Alkaline Phosphatase 63 38 - 126 U/L   Total Bilirubin 2.4 (H) 0.3 - 1.2 mg/dL   GFR calc non Af Amer >60 >60 mL/min   GFR calc Af Amer >60 >60 mL/min   Anion gap 10 5 - 15    Comment: Performed at [facility], [address] [place], [place] [address]  Lipase, blood     Status: None   Collection Time: 07/07/18  8:54 PM  Result Value Ref Range   Lipase 41 11 - 51 U/L    Comment: Performed at [facility], [address] [place], [place] [address]  I-Stat CG4 Lactic Acid, ED     Status: Abnormal   Collection Time: 07/07/18  8:59 PM  Result Value Ref Range   Lactic Acid, Venous 1.91 (H) 0.5 - 1.9 mmol/L  I-Stat CG4 Lactic Acid, ED     Status: None   Collection Time: 07/07/18 10:54 PM  Result Value Ref Range   Lactic Acid, Venous 1.64  0.5 - 1.9 mmol/L    Ct Head Wo Contrast  Result Date: 07/07/2018 CLINICAL DATA:  Frontal headache and bilateral left laceration after tripping and falling today. EXAM: CT HEAD WITHOUT CONTRAST CT MAXILLOFACIAL WITHOUT CONTRAST TECHNIQUE: Multidetector CT imaging of the head and maxillofacial structures were performed using the standard protocol without intravenous contrast. Multiplanar CT image reconstructions of the maxillofacial structures were also generated. COMPARISON:  Brain MR dated 04/25/2010 and head CT dated 04/25/2010. FINDINGS: CT HEAD FINDINGS Brain: Stable mildly enlarged ventricles and cortical sulci. Minimal patchy white matter low density in both cerebral hemispheres with mild progression. No intracranial hemorrhage, mass lesion or CT evidence of acute infarction. Vascular: No hyperdense vessel or unexpected calcification. Skull: Bilateral hyperostosis frontalis. No skull fracture. Sinuses/Orbits: Unremarkable. Other: None. CT MAXILLOFACIAL FINDINGS Osseous: No fracture or mandibular dislocation. No destructive process. Orbits: Negative. No traumatic or inflammatory finding. Sinuses: Mucosal thickening in the inferior right maxillary sinus. Soft tissues: Unremarkable. IMPRESSION: 1. No skull fracture or intracranial hemorrhage. 2. No maxillofacial fracture. 3. Mildly progressive minimal chronic small vessel white matter ischemic changes in both cerebral hemispheres. 4. Stable mild diffuse cerebral and cerebellar atrophy. 5. Mild chronic right maxillary sinusitis. Electronically Signed   By: Claudie Revering M.D.   On: 07/07/2018 22:18   Ct Chest W Contrast  Result Date: 07/07/2018 CLINICAL DATA:  Chest pain with deep inspiration following a fall today. EXAM: CT CHEST, ABDOMEN, AND PELVIS WITH CONTRAST TECHNIQUE: Multidetector CT imaging of the chest, abdomen and pelvis was performed following the standard protocol during bolus administration of intravenous contrast. CONTRAST:  142mL  ISOVUE-300 IOPAMIDOL (ISOVUE-300) INJECTION 61% COMPARISON:  Abdomen and pelvis CT dated 12/12/2007. Chest radiographs dated 10/29/2010. FINDINGS: CT CHEST FINDINGS Cardiovascular: Atheromatous calcifications, including the coronary arteries and aorta. Normal sized heart. Mediastinum/Nodes: No enlarged lymph nodes. Diffusely enlarged and heterogeneous thyroid gland containing multiple calcified and noncalcified nodules.  The largest nodule is on the left and is heterogeneous with a low density component and high density component and measures 4.1 cm in maximum diameter on image number 9 series 2. Lungs/Pleura: Mild left and minimal right dependent atelectasis. Left lower lobe calcified granuloma. No airspace consolidation or pleural fluid. Musculoskeletal: Focal angulation deformities of the anterolateral aspects of the 3rd, 4th and 5th ribs bilaterally and right 6th rib. There are small cortical step-offs involving the right 4th, 5th and 6th ribs. Thoracic spine degenerative changes and dextroconvex scoliosis. CT ABDOMEN PELVIS FINDINGS Hepatobiliary: Right lobe liver calcified granuloma. Cholecystectomy clips. Pancreas: Unremarkable. No pancreatic ductal dilatation or surrounding inflammatory changes. Spleen: Normal in size without focal abnormality. Adrenals/Urinary Tract: No significant change in an oval right adrenal mass measuring 1.6 x 1.3 cm on image number 52 series 2. Normal appearing left adrenal gland. Stable rounded fat density mass in the lower pole of the left kidney, measuring 2.9 cm in maximum diameter. This continues to have a minimal curvilinear soft tissue density component. Interval 1.8 cm similar appearing fat density mass in the mid right kidney. Unremarkable urinary bladder and ureters. Stomach/Bowel: Post gastric bypass changes. Small bowel loops in a right lower anterolateral wide-mouth abdominal wall hernia without dilatation or wall thickening. Unremarkable colon. Surgically absent  appendix. Vascular/Lymphatic: Mild atheromatous arterial calcifications. No enlarged lymph nodes. Reproductive: Status post hysterectomy. No adnexal masses. Other: Moderate-sized wide-mouth right anterolateral abdominal wall hernia containing herniated fat and small bowel loops. This is at the level of the upper pelvis. Musculoskeletal: Lumbar spine degenerative changes. No fractures, pars defects or subluxations. The pelvic bones are intact with a small left iliac bone island noted. IMPRESSION: 1. Essentially nondisplaced fractures of the anterolateral aspects of the 3rd, 4th and 5th ribs bilaterally and right 6th rib. 2. Stable right adrenal adenoma. 3. Stable left renal angiomyolipoma and interval small right renal angiomyolipoma. 4. Multinodular thyroid goiter with a dominant nodule on the left measuring 4.1 cm in maximum diameter. Consider further evaluation with thyroid ultrasound. If patient is clinically hyperthyroid, consider nuclear medicine thyroid uptake and scan. 5. Moderate-sized wide-mouth right anterolateral abdominal wall hernia containing herniated fat and small bowel loops without obstruction. 6.  Calcific coronary artery and aortic atherosclerosis. Aortic Atherosclerosis (ICD10-I70.0). Electronically Signed   By: Claudie Revering M.D.   On: 07/07/2018 22:43   Ct Abdomen Pelvis W Contrast  Result Date: 07/07/2018 CLINICAL DATA:  Chest pain with deep inspiration following a fall today. EXAM: CT CHEST, ABDOMEN, AND PELVIS WITH CONTRAST TECHNIQUE: Multidetector CT imaging of the chest, abdomen and pelvis was performed following the standard protocol during bolus administration of intravenous contrast. CONTRAST:  152mL ISOVUE-300 IOPAMIDOL (ISOVUE-300) INJECTION 61% COMPARISON:  Abdomen and pelvis CT dated 12/12/2007. Chest radiographs dated 10/29/2010. FINDINGS: CT CHEST FINDINGS Cardiovascular: Atheromatous calcifications, including the coronary arteries and aorta. Normal sized heart.  Mediastinum/Nodes: No enlarged lymph nodes. Diffusely enlarged and heterogeneous thyroid gland containing multiple calcified and noncalcified nodules. The largest nodule is on the left and is heterogeneous with a low density component and high density component and measures 4.1 cm in maximum diameter on image number 9 series 2. Lungs/Pleura: Mild left and minimal right dependent atelectasis. Left lower lobe calcified granuloma. No airspace consolidation or pleural fluid. Musculoskeletal: Focal angulation deformities of the anterolateral aspects of the 3rd, 4th and 5th ribs bilaterally and right 6th rib. There are small cortical step-offs involving the right 4th, 5th and 6th ribs. Thoracic spine degenerative changes and dextroconvex scoliosis. CT  ABDOMEN PELVIS FINDINGS Hepatobiliary: Right lobe liver calcified granuloma. Cholecystectomy clips. Pancreas: Unremarkable. No pancreatic ductal dilatation or surrounding inflammatory changes. Spleen: Normal in size without focal abnormality. Adrenals/Urinary Tract: No significant change in an oval right adrenal mass measuring 1.6 x 1.3 cm on image number 52 series 2. Normal appearing left adrenal gland. Stable rounded fat density mass in the lower pole of the left kidney, measuring 2.9 cm in maximum diameter. This continues to have a minimal curvilinear soft tissue density component. Interval 1.8 cm similar appearing fat density mass in the mid right kidney. Unremarkable urinary bladder and ureters. Stomach/Bowel: Post gastric bypass changes. Small bowel loops in a right lower anterolateral wide-mouth abdominal wall hernia without dilatation or wall thickening. Unremarkable colon. Surgically absent appendix. Vascular/Lymphatic: Mild atheromatous arterial calcifications. No enlarged lymph nodes. Reproductive: Status post hysterectomy. No adnexal masses. Other: Moderate-sized wide-mouth right anterolateral abdominal wall hernia containing herniated fat and small bowel loops.  This is at the level of the upper pelvis. Musculoskeletal: Lumbar spine degenerative changes. No fractures, pars defects or subluxations. The pelvic bones are intact with a small left iliac bone island noted. IMPRESSION: 1. Essentially nondisplaced fractures of the anterolateral aspects of the 3rd, 4th and 5th ribs bilaterally and right 6th rib. 2. Stable right adrenal adenoma. 3. Stable left renal angiomyolipoma and interval small right renal angiomyolipoma. 4. Multinodular thyroid goiter with a dominant nodule on the left measuring 4.1 cm in maximum diameter. Consider further evaluation with thyroid ultrasound. If patient is clinically hyperthyroid, consider nuclear medicine thyroid uptake and scan. 5. Moderate-sized wide-mouth right anterolateral abdominal wall hernia containing herniated fat and small bowel loops without obstruction. 6.  Calcific coronary artery and aortic atherosclerosis. Aortic Atherosclerosis (ICD10-I70.0). Electronically Signed   By: Claudie Revering M.D.   On: 07/07/2018 22:43   Ct Maxillofacial Wo Contrast  Result Date: 07/07/2018 CLINICAL DATA:  Frontal headache and bilateral left laceration after tripping and falling today. EXAM: CT HEAD WITHOUT CONTRAST CT MAXILLOFACIAL WITHOUT CONTRAST TECHNIQUE: Multidetector CT imaging of the head and maxillofacial structures were performed using the standard protocol without intravenous contrast. Multiplanar CT image reconstructions of the maxillofacial structures were also generated. COMPARISON:  Brain MR dated 04/25/2010 and head CT dated 04/25/2010. FINDINGS: CT HEAD FINDINGS Brain: Stable mildly enlarged ventricles and cortical sulci. Minimal patchy white matter low density in both cerebral hemispheres with mild progression. No intracranial hemorrhage, mass lesion or CT evidence of acute infarction. Vascular: No hyperdense vessel or unexpected calcification. Skull: Bilateral hyperostosis frontalis. No skull fracture. Sinuses/Orbits:  Unremarkable. Other: None. CT MAXILLOFACIAL FINDINGS Osseous: No fracture or mandibular dislocation. No destructive process. Orbits: Negative. No traumatic or inflammatory finding. Sinuses: Mucosal thickening in the inferior right maxillary sinus. Soft tissues: Unremarkable. IMPRESSION: 1. No skull fracture or intracranial hemorrhage. 2. No maxillofacial fracture. 3. Mildly progressive minimal chronic small vessel white matter ischemic changes in both cerebral hemispheres. 4. Stable mild diffuse cerebral and cerebellar atrophy. 5. Mild chronic right maxillary sinusitis. Electronically Signed   By: Claudie Revering M.D.   On: 07/07/2018 22:18    Review of Systems  Constitutional: Negative for chills and fever.  HENT: Negative for hearing loss.   Eyes: Negative for blurred vision and double vision.  Respiratory: Negative for cough and hemoptysis.   Cardiovascular: Positive for chest pain. Negative for palpitations.  Gastrointestinal: Positive for abdominal pain. Negative for nausea and vomiting.  Genitourinary: Negative for dysuria and urgency.  Musculoskeletal: Negative for myalgias and neck pain.  Skin: Negative for  itching and rash.  Neurological: Negative for dizziness, tingling and headaches.  Endo/Heme/Allergies: Does not bruise/bleed easily.  Psychiatric/Behavioral: Negative for depression and suicidal ideas.   Blood pressure 118/74, pulse 72, temperature 97.9 F (36.6 C), temperature source Oral, resp. rate 18, height 5\' 2"  (1.575 m), weight 115.7 kg, last menstrual period 04/01/2013, SpO2 92 %. Physical Exam  Vitals reviewed. Constitutional: She is oriented to person, place, and time. She appears well-developed and well-nourished.  HENT:  Head: Normocephalic and atraumatic.  Eyes: Pupils are equal, round, and reactive to light. Conjunctivae and EOM are normal.  Neck: Normal range of motion. Neck supple.  Cardiovascular: Normal rate and regular rhythm.  Respiratory: Effort normal and  breath sounds normal. She exhibits tenderness.  GI: Soft. Bowel sounds are normal. She exhibits no distension. There is tenderness in the right upper quadrant and left upper quadrant.  Musculoskeletal: Normal range of motion.  Neurological: She is alert and oriented to person, place, and time.  Skin: Skin is warm and dry.  Psychiatric: She has a normal mood and affect. Her behavior is normal.      Assessment/Plan: 72 yo female with 7 rib fractures, no flail segment, no displacement, breathing well on room air -observe for pain and ambulation -ok for diet -elevate head of bed -pulm toilet -PO and IV pain control -XR in am  Procedures: none  Arta Bruce Alger Kerstein 07/08/2018, 2:06 AM

## 2018-07-08 NOTE — ED Notes (Signed)
[facility] has arrived for transport to [facility] ED.

## 2018-07-08 NOTE — ED Notes (Signed)
Patient using phone at this time.

## 2018-07-08 NOTE — Evaluation (Signed)
Physical Therapy Evaluation Patient Details Name: Veronica Simpson MRN: [id number] DOB: January 11, 1946 Today's Date: 07/08/2018   History of Present Illness  72 y.o. female admitted on 07/07/18 after falling in the parking lot at [facility] sustaining multiple bil rib fx R3-6 and L 3-5, R great toe pain (x- ray negative for fx).  Pt with significant PMH of stroke, HTN, L wrist surgery.    Clinical Impression  Pt was able to walk a short distance around her room with two person light hand held assist.  She would likely be more independent with the light support of RW.  O2 sats dipped a little (89%) on RA during gait, but quickly returned to the mid 90s with pursed lip breathing and standing rest break (<1 min).  She plans to return home with her husband's supervision at discharge.   PT to follow acutely for deficits listed below.      Follow Up Recommendations Home health PT    Equipment Recommendations  Rolling walker with 5" wheels    Recommendations for Other Services   NA    Precautions / Restrictions Precautions Precautions: Fall Precaution Comments: Per pt report she is "clumsy" stumbles a lot, but never has fallen like this before.       Mobility  Bed Mobility Overal bed mobility: Needs Assistance Bed Mobility: Supine to Sit;Sit to Supine     Supine to sit: Min assist Sit to supine: Min assist   General bed mobility comments: Min assist to help support trunk to get to sitting, verbal cues to avoid heavy pushing or pulling with her arms due to pain.   Transfers Overall transfer level: Needs assistance Equipment used: 2 person hand held assist Transfers: Sit to/from Stand Sit to Stand: +2 physical assistance;Min assist         General transfer comment: Two person min hand held assist to come to standing.   Ambulation/Gait Ambulation/Gait assistance: +2 physical assistance;Min assist Gait Distance (Feet): 30 Feet Assistive device: 2 person hand held assist Gait  Pattern/deviations: Step-through pattern;Antalgic Gait velocity: decreased('I'm a slow walker normally") Gait velocity interpretation: 1.31 - 2.62 ft/sec, indicative of limited community ambulator General Gait Details: Pt with two person light hand held assist to steady herself during gait.           Balance Overall balance assessment: Needs assistance Sitting-balance support: Feet supported;Bilateral upper extremity supported;No upper extremity supported Sitting balance-Leahy Scale: Good     Standing balance support: Bilateral upper extremity supported;No upper extremity supported;Single extremity supported Standing balance-Leahy Scale: Fair                               Pertinent Vitals/Pain Pain Assessment: Faces Faces Pain Scale: Hurts even more Pain Location: bil ribs Pain Descriptors / Indicators: Grimacing;Guarding Pain Intervention(s): Limited activity within patient's tolerance;Monitored during session;Repositioned    Home Living Family/patient expects to be discharged to:: Private residence Living Arrangements: Spouse/significant other Available Help at Discharge: Family;Available 24 hours/day Type of Home: House Home Access: Stairs to enter Entrance Stairs-Rails: None Entrance Stairs-Number of Steps: 3 Home Layout: One level Home Equipment: Cane - single point;Shower seat - built in      Prior Function Level of Independence: Independent         Comments: drives, generally does what she wants     Hand Dominance   Dominant Hand: Right    Extremity/Trunk Assessment   Upper Extremity Assessment Upper Extremity  Assessment: Generalized weakness(due to pain with resistance bil)    Lower Extremity Assessment Lower Extremity Assessment: Generalized weakness(a bit of a limp due to great toe pain, and reports plantarfa)    Cervical / Trunk Assessment Cervical / Trunk Assessment: Normal  Communication   Communication: No difficulties   Cognition Arousal/Alertness: Awake/alert Behavior During Therapy: WFL for tasks assessed/performed Overall Cognitive Status: Within Functional Limits for tasks assessed                                        General Comments General comments (skin integrity, edema, etc.): Educated pt re: bracing with a pillow or self hug to cough and sneeze. Pt demonstrated good IS use up to 1200 mL max imspired volume.  O2 sats on RA 94% with gait. She dropped to 89% at one point, but came back to mid 90s with standing rest break and pursed lip breathing.         Assessment/Plan    PT Assessment Patient needs continued PT services  PT Problem List Decreased activity tolerance;Decreased mobility;Decreased balance;Decreased knowledge of use of DME;Decreased knowledge of precautions;Pain;Cardiopulmonary status limiting activity       PT Treatment Interventions DME instruction;Stair training;Gait training;Functional mobility training;Therapeutic activities;Therapeutic exercise;Balance training;Patient/family education    PT Goals (Current goals can be found in the Care Plan section)  Acute Rehab PT Goals Patient Stated Goal: to return home and decrease pain, 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 PT Goal Formulation: With patient Time For Goal Achievement: 07/22/18 Potential to Achieve Goals: Good    Frequency Min 3X/week           AM-PAC PT "6 Clicks" Mobility  Outcome Measure Help needed turning from your back to your side while in a flat bed without using bedrails?: A Little Help needed moving from lying on your back to sitting on the side of a flat bed without using bedrails?: A Little Help needed moving to and from a bed to a chair (including a wheelchair)?: A Little Help needed standing up from a chair using your arms (e.g., wheelchair or bedside chair)?: A Little Help needed to walk in hospital room?: A Little Help needed climbing 3-5 steps with a railing? : A Little 6 Click  Score: 18    End of Session   Activity Tolerance: Patient limited by pain Patient left: in bed;with call bell/phone within reach;with family/visitor present Nurse Communication: Mobility status PT Visit Diagnosis: Muscle weakness (generalized) (M62.81);Difficulty in walking, not elsewhere classified (R26.2);Pain Pain - Right/Left: (bil) Pain - part of body: (ribs)    Time: 1062-6948 PT Time Calculation (min) (ACUTE ONLY): 27 min   Charges:           Wells Guiles B. Seibert Keeter, PT, DPT  Acute Rehabilitation [phone] pager #[phone] office   PT Evaluation $PT Eval Moderate Complexity: 1 Mod PT Treatments $Gait Training: 8-22 mins       07/08/2018, 10:26 PM

## 2018-07-09 ENCOUNTER — Observation Stay (HOSPITAL_COMMUNITY): Payer: Medicare Other

## 2018-07-09 MED ORDER — ACETAMINOPHEN 325 MG PO TABS: 650.0000 mg | ORAL_TABLET | Freq: Four times a day (QID) | ORAL | Status: AC | PRN

## 2018-07-09 MED ORDER — DOCUSATE SODIUM 100 MG PO CAPS: 100.0000 mg | ORAL_CAPSULE | Freq: Two times a day (BID) | ORAL | 0 refills | Status: AC

## 2018-07-09 MED ORDER — METHOCARBAMOL 500 MG PO TABS: 500.0000 mg | ORAL_TABLET | Freq: Three times a day (TID) | ORAL | 0 refills | Status: AC | PRN

## 2018-07-09 MED ORDER — OXYCODONE HCL 5 MG PO TABS: 5.0000 mg | ORAL_TABLET | Freq: Four times a day (QID) | ORAL | 0 refills | Status: AC | PRN

## 2018-07-09 NOTE — Progress Notes (Signed)
Physical Therapy Treatment Patient Details Name: Veronica Simpson MRN: [id number] DOB: Mar 21, 1946 Today's Date: 07/09/2018    History of Present Illness 72 y.o. female admitted on 07/07/18 after falling in the parking lot at [facility] sustaining multiple bil rib fx R3-6 and L 3-5, R great toe pain (x- ray negative for fx).  Pt with significant PMH of stroke, HTN, L wrist surgery.      PT Comments    Patient seen for mobility progression. Pt reports feeling better today versus yesterday. Pt is making progress toward PT goals and tolerated increased activity this session. Pt is able to ambulate 100 ft with min guard assist using RW and ascend/descend stairs simulating home entrance with min guard/min A. Pt with 2/4 DOE with mobility and requires standing rest breaks which pt reports is "normal" for her. SpO2 remained >90% on RA with mobility. Current plan remains appropriate.    Follow Up Recommendations  Home health PT     Equipment Recommendations  Rolling walker with 5" wheels    Recommendations for Other Services       Precautions / Restrictions Precautions Precautions: Fall Precaution Comments: Per pt report she is "clumsy" stumbles a lot, but never has fallen like this before.     Mobility  Bed Mobility Overal bed mobility: Needs Assistance Bed Mobility: Supine to Sit     Supine to sit: Min assist     General bed mobility comments: assist to elevate trunk into sitting due to pain; cues for sequencing   Transfers Overall transfer level: Needs assistance Equipment used: Rolling walker (2 wheeled) Transfers: Sit to/from Stand Sit to Stand: Min guard         General transfer comment: min guard for safety to stand from EOB; cues for safe hand placement; use of RW upon standing  Ambulation/Gait Ambulation/Gait assistance: Min guard Gait Distance (Feet): 100 Feet Assistive device: Rolling walker (2 wheeled) Gait Pattern/deviations: Step-through pattern;Decreased  stride length Gait velocity: decreased   General Gait Details: cues for posture and safe use of AD; decreased cadence but steady with UE support; pt with 2/4 DOE and need for standing rest breaks; SpO2 >90% on RA with mobility   Stairs Stairs: Yes Stairs assistance: Min guard;Min assist Stair Management: One rail Right;Step to pattern;Sideways;No rails;Backwards;With walker Number of Stairs: 4 General stair comments: 4 steps sideways using R hand rail and single step using RW backwards to simulate home entrance    Wheelchair Mobility    Modified Rankin (Stroke Patients Only)       Balance Overall balance assessment: Needs assistance Sitting-balance support: Feet supported;Bilateral upper extremity supported;No upper extremity supported Sitting balance-Leahy Scale: Good     Standing balance support: Bilateral upper extremity supported;No upper extremity supported;Single extremity supported Standing balance-Leahy Scale: Fair                              Cognition Arousal/Alertness: Awake/alert Behavior During Therapy: WFL for tasks assessed/performed Overall Cognitive Status: Within Functional Limits for tasks assessed                                        Exercises      General Comments        Pertinent Vitals/Pain Pain Assessment: Faces Faces Pain Scale: Hurts little more Pain Location: bil ribs Pain Descriptors / Indicators: Grimacing;Guarding Pain  Intervention(s): Limited activity within patient's tolerance;Monitored during session;Repositioned    Home Living                      Prior Function            PT Goals (current goals can now be found in the care plan section) Acute Rehab PT Goals Patient Stated Goal: to return home and decrease pain, 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 Progress towards PT goals: Progressing toward goals    Frequency    Min 3X/week      PT Plan Current plan remains appropriate     Co-evaluation              AM-PAC PT "6 Clicks" Mobility   Outcome Measure  Help needed turning from your back to your side while in a flat bed without using bedrails?: A Little Help needed moving from lying on your back to sitting on the side of a flat bed without using bedrails?: A Little Help needed moving to and from a bed to a chair (including a wheelchair)?: A Little Help needed standing up from a chair using your arms (e.g., wheelchair or bedside chair)?: A Little Help needed to walk in hospital room?: A Little Help needed climbing 3-5 steps with a railing? : A Little 6 Click Score: 18    End of Session   Activity Tolerance: Patient tolerated treatment well Patient left: with call bell/phone within reach;in chair Nurse Communication: Mobility status PT Visit Diagnosis: Muscle weakness (generalized) (M62.81);Difficulty in walking, not elsewhere classified (R26.2);Pain Pain - Right/Left: (bil) Pain - part of body: (ribs)     Time: 4034-7425 PT Time Calculation (min) (ACUTE ONLY): 25 min  Charges:  $Gait Training: 23-37 mins                     Earney Navy, PTA Acute Rehabilitation Services Pager: [phone] Office: [phone]     Darliss Cheney 07/09/2018, 10:03 AM

## 2018-07-09 NOTE — Care Management Obs Status (Signed)
[place] NOTIFICATION   Patient Details  Name: Veronica Simpson MRN: [id number] Date of Birth: Apr 23, 1946   Medicare Observation Status Notification Given:       Ella Bodo, RN 07/09/2018, 2:38 PM

## 2018-07-09 NOTE — Evaluation (Signed)
Occupational Therapy Evaluation Patient Details Name: Veronica Simpson MRN: [id number] DOB: February 18, 1946 Today's Date: 07/09/2018    History of Present Illness 72 y.o. female admitted on 07/07/18 after falling in the parking lot at [facility] sustaining multiple bil rib fx R3-6 and L 3-5, R great toe pain (x- ray negative for fx).  Pt with significant PMH of stroke, HTN, L wrist surgery.     Clinical Impression   Pt is a 72 yo female s/p fall in parking lot resulting in above deficits. Pt presents with dyspnea upon exertion, O2 levels remained >92% on RA and checked throughout session. Pt with pain reported with deep breaths and pt reporting to use incentive spirometer frequently. Pt MinA for ADL functional transfers and mobility using RW. Pt managing LB ADLs with MinA and performing own toilet hygiene with Min/guard. Pt leaning on counter for stability and to decrease pain in ribs for light grooming at sink. Pt requiring verbal cues for proper hand placement for safe transfers. Pt would benefit from continued OT services and recommend [place] for ADLs, mobility, safety and energy conservation. Thank you for this referral.     Follow Up Recommendations  Home health OT    Equipment Recommendations  None recommended by OT    Recommendations for Other Services       Precautions / Restrictions Precautions Precautions: Fall Precaution Comments: Per pt report she is "clumsy" stumbles a lot, but never has fallen like this before.       Mobility Bed Mobility            General bed mobility comments: Received sitting up in recliner.  Transfers Overall transfer level: Needs assistance Equipment used: Rolling walker (2 wheeled) Transfers: Sit to/from Stand Sit to Stand: Min assist         General transfer comment: Pt sitting edge of chair and commode; able to rise with Min A after cues for proper hand placement    Balance Overall balance assessment: Needs  assistance Sitting-balance support: Feet supported;Bilateral upper extremity supported Sitting balance-Leahy Scale: Good     Standing balance support: Bilateral upper extremity supported;No upper extremity supported;Single extremity supported Standing balance-Leahy Scale: Fair Standing balance comment: leans on elbows at sink for ebergy conservation                           ADL either performed or assessed with clinical judgement   ADL Overall ADL's : Needs assistance/impaired Eating/Feeding: Modified independent   Grooming: Set up;Cueing for safety   Upper Body Bathing: Minimal assistance   Lower Body Bathing: Minimal assistance Lower Body Bathing Details (indicate cue type and reason): cueing to take deep breaths rather than hold breath Upper Body Dressing : Minimal assistance   Lower Body Dressing: Minimal assistance Lower Body Dressing Details (indicate cue type and reason): figure 4 technique for sock donning Toilet Transfer: Min guard Toilet Transfer Details (indicate cue type and reason): cues for safety and grab bars selection instead of using RW for support Toileting- Clothing Manipulation and Hygiene: Min guard       Functional mobility during ADLs: Min guard;Rolling walker General ADL Comments: Pt managing upper body and lower body ADLs with compensatory strategies due to rib pain and inaiblity to excessively bend.  (Pt has family at home to assist with ADLs as needed)     Vision         Perception     Praxis  Pertinent Vitals/Pain Pain Assessment: Faces Faces Pain Scale: Hurts even more Pain Location: bil ribs Pain Descriptors / Indicators: Grimacing;Guarding Pain Intervention(s): Repositioned     Hand Dominance Right   Extremity/Trunk Assessment Upper Extremity Assessment Upper Extremity Assessment: Overall WFL for tasks assessed   Lower Extremity Assessment Lower Extremity Assessment: Defer to PT evaluation   Cervical / Trunk  Assessment Cervical / Trunk Assessment: Normal   Communication Communication Communication: No difficulties   Cognition Arousal/Alertness: Awake/alert Behavior During Therapy: WFL for tasks assessed/performed Overall Cognitive Status: Within Functional Limits for tasks assessed                                     General Comments  Educated patient on asking for assistance from spouse and continuiung incentive spirometers w/ deep breaths w/ mobility.    Exercises     Shoulder Instructions      Home Living Family/patient expects to be discharged to:: Private residence Living Arrangements: Spouse/significant other Available Help at Discharge: Family;Available 24 hours/day Type of Home: House Home Access: Stairs to enter [employer] of Steps: 3 Entrance Stairs-Rails: None Home Layout: One level     Bathroom Shower/Tub: Occupational psychologist: Handicapped height     Home Equipment: [place] - single point;Shower seat - built in          Prior Functioning/Environment Level of Independence: Independent        Comments: drives, generally does what she wants; works 15 hours/week        OT Problem List: Decreased strength;Decreased activity tolerance;Impaired balance (sitting and/or standing);Decreased knowledge of use of DME or AE      OT Treatment/Interventions: Self-care/ADL training;Energy conservation;DME and/or AE instruction;Therapeutic activities;Patient/family education;Balance training    OT Goals(Current goals can be found in the care plan section) Acute Rehab OT Goals Patient Stated Goal: to return home, decrease pain and return to work  OT Goal Formulation: With patient/family Time For Goal Achievement: 07/23/18 Potential to Achieve Goals: Good  OT Frequency: Min 1X/week   Barriers to D/C:            Co-evaluation              AM-PAC OT "6 Clicks" Daily Activity     Outcome Measure Help from another person  eating meals?: None Help from another person taking care of personal grooming?: A Little Help from another person toileting, which includes using toliet, bedpan, or urinal?: A Little Help from another person bathing (including washing, rinsing, drying)?: A Little Help from another person to put on and taking off regular upper body clothing?: A Little Help from another person to put on and taking off regular lower body clothing?: A Little 6 Click Score: 19   End of Session Equipment Utilized During Treatment: Rolling walker  Activity Tolerance: Patient limited by pain;Patient tolerated treatment well Patient left: in bed;with family/visitor present;with call bell/phone within reach  OT Visit Diagnosis: Unsteadiness on feet (R26.81);Muscle weakness (generalized) (M62.81)                Time: 1026-1050 OT Time Calculation (min): 24 min Charges:  OT General Charges $OT Visit: 1 Visit OT Evaluation $OT Eval Moderate Complexity: 1 Mod OT Treatments $Self Care/Home Management : 8-22 mins  Ebony Hail Harold Hedge) Marsa Aris OTR/L Pager: [phone]  Fredda Hammed 07/09/2018, 11:17 AM

## 2018-07-09 NOTE — Discharge Instructions (Signed)

## 2018-07-09 NOTE — Progress Notes (Signed)
[facility] Progress Note     Subjective: CC-  Overall doing well. States that she is sore in her ribs, but overall pain well controlled. Denies SOB. Pulling 1250 on IS. On 2L O2 over night. Denies abdominal pain. Tolerating diet. Passing flatus. Worked with PT yesterday who recommends HH PT at discharge. OT consult pending.  Objective: Vital signs in last 24 hours: Temp:  [97.6 F (36.4 C)-98.6 F (37 C)] 98.2 F (36.8 C) (12/12 0537) Pulse Rate:  [53-74] 53 (12/12 0537) Resp:  [16-20] 20 (12/12 0537) BP: (111-142)/(51-88) 119/57 (12/12 0537) SpO2:  [95 %-99 %] 99 % (12/12 0537)    Intake/Output from previous day: 12/11 0701 - 12/12 0700 In: 480 [P.O.:480] Out: 1050 [Urine:1050] Intake/Output this shift: No intake/output data recorded.  PE: Gen:  Alert, NAD, pleasant HEENT: EOM's intact, pupils equal and round Card:  RRR Pulm:  CTAB, no W/R/R, effort normal, pulling 1250 on IS Abd: soft, NT, ND, +BS Ext: calves soft and nontender Psych: A&Ox3  Skin: no rashes noted, warm and dry   Lab Results:  Recent Labs    07/07/18 2054 07/08/18 0313  WBC 12.7* 8.2  HGB 14.9 13.8  HCT 45.6 41.7  PLT 328 284   BMET Recent Labs    07/07/18 2054 07/08/18 0313  NA 136 140  K 3.3* 3.1*  CL 99 102  CO2 27 25  GLUCOSE 142* 160*  BUN 15 13  CREATININE 0.73 0.79  CALCIUM 9.0 8.9   PT/INR No results for input(s): LABPROT, INR in the last 72 hours. CMP     Component Value Date/Time   NA 140 07/08/2018 0313   K 3.1 (L) 07/08/2018 0313   CL 102 07/08/2018 0313   CO2 25 07/08/2018 0313   GLUCOSE 160 (H) 07/08/2018 0313   BUN 13 07/08/2018 0313   CREATININE 0.79 07/08/2018 0313   CREATININE 0.64 11/08/2014 0945   CALCIUM 8.9 07/08/2018 0313   PROT 6.6 07/07/2018 2054   ALBUMIN 3.7 07/07/2018 2054   AST 42 (H) 07/07/2018 2054   ALT 42 07/07/2018 2054   ALKPHOS 63 07/07/2018 2054   BILITOT 2.4 (H) 07/07/2018 2054   GFRNONAA >60 07/08/2018 0313    GFRAA >60 07/08/2018 0313   Lipase     Component Value Date/Time   LIPASE 41 07/07/2018 2054       Studies/Results: Ct Head Wo Contrast  Result Date: 07/07/2018 CLINICAL DATA:  Frontal headache and bilateral left laceration after tripping and falling today. EXAM: CT HEAD WITHOUT CONTRAST CT MAXILLOFACIAL WITHOUT CONTRAST TECHNIQUE: Multidetector CT imaging of the head and maxillofacial structures were performed using the standard protocol without intravenous contrast. Multiplanar CT image reconstructions of the maxillofacial structures were also generated. COMPARISON:  Brain MR dated 04/25/2010 and head CT dated 04/25/2010. FINDINGS: CT HEAD FINDINGS Brain: Stable mildly enlarged ventricles and cortical sulci. Minimal patchy white matter low density in both cerebral hemispheres with mild progression. No intracranial hemorrhage, mass lesion or CT evidence of acute infarction. Vascular: No hyperdense vessel or unexpected calcification. Skull: Bilateral hyperostosis frontalis. No skull fracture. Sinuses/Orbits: Unremarkable. Other: None. CT MAXILLOFACIAL FINDINGS Osseous: No fracture or mandibular dislocation. No destructive process. Orbits: Negative. No traumatic or inflammatory finding. Sinuses: Mucosal thickening in the inferior right maxillary sinus. Soft tissues: Unremarkable. IMPRESSION: 1. No skull fracture or intracranial hemorrhage. 2. No maxillofacial fracture. 3. Mildly progressive minimal chronic small vessel white matter ischemic changes in both cerebral hemispheres. 4. Stable mild diffuse cerebral and cerebellar atrophy.  5. Mild chronic right maxillary sinusitis. Electronically Signed   By: Claudie Revering M.D.   On: 07/07/2018 22:18   Ct Chest W Contrast  Result Date: 07/07/2018 CLINICAL DATA:  Chest pain with deep inspiration following a fall today. EXAM: CT CHEST, ABDOMEN, AND PELVIS WITH CONTRAST TECHNIQUE: Multidetector CT imaging of the chest, abdomen and pelvis was performed  following the standard protocol during bolus administration of intravenous contrast. CONTRAST:  19mL ISOVUE-300 IOPAMIDOL (ISOVUE-300) INJECTION 61% COMPARISON:  Abdomen and pelvis CT dated 12/12/2007. Chest radiographs dated 10/29/2010. FINDINGS: CT CHEST FINDINGS Cardiovascular: Atheromatous calcifications, including the coronary arteries and aorta. Normal sized heart. Mediastinum/Nodes: No enlarged lymph nodes. Diffusely enlarged and heterogeneous thyroid gland containing multiple calcified and noncalcified nodules. The largest nodule is on the left and is heterogeneous with a low density component and high density component and measures 4.1 cm in maximum diameter on image number 9 series 2. Lungs/Pleura: Mild left and minimal right dependent atelectasis. Left lower lobe calcified granuloma. No airspace consolidation or pleural fluid. Musculoskeletal: Focal angulation deformities of the anterolateral aspects of the 3rd, 4th and 5th ribs bilaterally and right 6th rib. There are small cortical step-offs involving the right 4th, 5th and 6th ribs. Thoracic spine degenerative changes and dextroconvex scoliosis. CT ABDOMEN PELVIS FINDINGS Hepatobiliary: Right lobe liver calcified granuloma. Cholecystectomy clips. Pancreas: Unremarkable. No pancreatic ductal dilatation or surrounding inflammatory changes. Spleen: Normal in size without focal abnormality. Adrenals/Urinary Tract: No significant change in an oval right adrenal mass measuring 1.6 x 1.3 cm on image number 52 series 2. Normal appearing left adrenal gland. Stable rounded fat density mass in the lower pole of the left kidney, measuring 2.9 cm in maximum diameter. This continues to have a minimal curvilinear soft tissue density component. Interval 1.8 cm similar appearing fat density mass in the mid right kidney. Unremarkable urinary bladder and ureters. Stomach/Bowel: Post gastric bypass changes. Small bowel loops in a right lower anterolateral wide-mouth  abdominal wall hernia without dilatation or wall thickening. Unremarkable colon. Surgically absent appendix. Vascular/Lymphatic: Mild atheromatous arterial calcifications. No enlarged lymph nodes. Reproductive: Status post hysterectomy. No adnexal masses. Other: Moderate-sized wide-mouth right anterolateral abdominal wall hernia containing herniated fat and small bowel loops. This is at the level of the upper pelvis. Musculoskeletal: Lumbar spine degenerative changes. No fractures, pars defects or subluxations. The pelvic bones are intact with a small left iliac bone island noted. IMPRESSION: 1. Essentially nondisplaced fractures of the anterolateral aspects of the 3rd, 4th and 5th ribs bilaterally and right 6th rib. 2. Stable right adrenal adenoma. 3. Stable left renal angiomyolipoma and interval small right renal angiomyolipoma. 4. Multinodular thyroid goiter with a dominant nodule on the left measuring 4.1 cm in maximum diameter. Consider further evaluation with thyroid ultrasound. If patient is clinically hyperthyroid, consider nuclear medicine thyroid uptake and scan. 5. Moderate-sized wide-mouth right anterolateral abdominal wall hernia containing herniated fat and small bowel loops without obstruction. 6.  Calcific coronary artery and aortic atherosclerosis. Aortic Atherosclerosis (ICD10-I70.0). Electronically Signed   By: Claudie Revering M.D.   On: 07/07/2018 22:43   Ct Abdomen Pelvis W Contrast  Result Date: 07/07/2018 CLINICAL DATA:  Chest pain with deep inspiration following a fall today. EXAM: CT CHEST, ABDOMEN, AND PELVIS WITH CONTRAST TECHNIQUE: Multidetector CT imaging of the chest, abdomen and pelvis was performed following the standard protocol during bolus administration of intravenous contrast. CONTRAST:  149mL ISOVUE-300 IOPAMIDOL (ISOVUE-300) INJECTION 61% COMPARISON:  Abdomen and pelvis CT dated 12/12/2007. Chest radiographs dated 10/29/2010.  FINDINGS: CT CHEST FINDINGS Cardiovascular:  Atheromatous calcifications, including the coronary arteries and aorta. Normal sized heart. Mediastinum/Nodes: No enlarged lymph nodes. Diffusely enlarged and heterogeneous thyroid gland containing multiple calcified and noncalcified nodules. The largest nodule is on the left and is heterogeneous with a low density component and high density component and measures 4.1 cm in maximum diameter on image number 9 series 2. Lungs/Pleura: Mild left and minimal right dependent atelectasis. Left lower lobe calcified granuloma. No airspace consolidation or pleural fluid. Musculoskeletal: Focal angulation deformities of the anterolateral aspects of the 3rd, 4th and 5th ribs bilaterally and right 6th rib. There are small cortical step-offs involving the right 4th, 5th and 6th ribs. Thoracic spine degenerative changes and dextroconvex scoliosis. CT ABDOMEN PELVIS FINDINGS Hepatobiliary: Right lobe liver calcified granuloma. Cholecystectomy clips. Pancreas: Unremarkable. No pancreatic ductal dilatation or surrounding inflammatory changes. Spleen: Normal in size without focal abnormality. Adrenals/Urinary Tract: No significant change in an oval right adrenal mass measuring 1.6 x 1.3 cm on image number 52 series 2. Normal appearing left adrenal gland. Stable rounded fat density mass in the lower pole of the left kidney, measuring 2.9 cm in maximum diameter. This continues to have a minimal curvilinear soft tissue density component. Interval 1.8 cm similar appearing fat density mass in the mid right kidney. Unremarkable urinary bladder and ureters. Stomach/Bowel: Post gastric bypass changes. Small bowel loops in a right lower anterolateral wide-mouth abdominal wall hernia without dilatation or wall thickening. Unremarkable colon. Surgically absent appendix. Vascular/Lymphatic: Mild atheromatous arterial calcifications. No enlarged lymph nodes. Reproductive: Status post hysterectomy. No adnexal masses. Other: Moderate-sized  wide-mouth right anterolateral abdominal wall hernia containing herniated fat and small bowel loops. This is at the level of the upper pelvis. Musculoskeletal: Lumbar spine degenerative changes. No fractures, pars defects or subluxations. The pelvic bones are intact with a small left iliac bone island noted. IMPRESSION: 1. Essentially nondisplaced fractures of the anterolateral aspects of the 3rd, 4th and 5th ribs bilaterally and right 6th rib. 2. Stable right adrenal adenoma. 3. Stable left renal angiomyolipoma and interval small right renal angiomyolipoma. 4. Multinodular thyroid goiter with a dominant nodule on the left measuring 4.1 cm in maximum diameter. Consider further evaluation with thyroid ultrasound. If patient is clinically hyperthyroid, consider nuclear medicine thyroid uptake and scan. 5. Moderate-sized wide-mouth right anterolateral abdominal wall hernia containing herniated fat and small bowel loops without obstruction. 6.  Calcific coronary artery and aortic atherosclerosis. Aortic Atherosclerosis (ICD10-I70.0). Electronically Signed   By: Claudie Revering M.D.   On: 07/07/2018 22:43   Dg Toe Great Right  Result Date: 07/08/2018 CLINICAL DATA:  Fall. Great toe pain and bruising. Initial encounter. EXAM: RIGHT GREAT TOE COMPARISON:  None. FINDINGS: No acute fracture or dislocation is identified. There is minimal spurring at the first MTP joint. No significant soft tissue abnormality is seen. IMPRESSION: No acute osseous abnormality. Electronically Signed   By: Logan Bores M.D.   On: 07/08/2018 12:23   Ct Maxillofacial Wo Contrast  Result Date: 07/07/2018 CLINICAL DATA:  Frontal headache and bilateral left laceration after tripping and falling today. EXAM: CT HEAD WITHOUT CONTRAST CT MAXILLOFACIAL WITHOUT CONTRAST TECHNIQUE: Multidetector CT imaging of the head and maxillofacial structures were performed using the standard protocol without intravenous contrast. Multiplanar CT image  reconstructions of the maxillofacial structures were also generated. COMPARISON:  Brain MR dated 04/25/2010 and head CT dated 04/25/2010. FINDINGS: CT HEAD FINDINGS Brain: Stable mildly enlarged ventricles and cortical sulci. Minimal patchy white matter low density in  both cerebral hemispheres with mild progression. No intracranial hemorrhage, mass lesion or CT evidence of acute infarction. Vascular: No hyperdense vessel or unexpected calcification. Skull: Bilateral hyperostosis frontalis. No skull fracture. Sinuses/Orbits: Unremarkable. Other: None. CT MAXILLOFACIAL FINDINGS Osseous: No fracture or mandibular dislocation. No destructive process. Orbits: Negative. No traumatic or inflammatory finding. Sinuses: Mucosal thickening in the inferior right maxillary sinus. Soft tissues: Unremarkable. IMPRESSION: 1. No skull fracture or intracranial hemorrhage. 2. No maxillofacial fracture. 3. Mildly progressive minimal chronic small vessel white matter ischemic changes in both cerebral hemispheres. 4. Stable mild diffuse cerebral and cerebellar atrophy. 5. Mild chronic right maxillary sinusitis. Electronically Signed   By: Claudie Revering M.D.   On: 07/07/2018 22:18    Anti-infectives: Anti-infectives (From admission, onward)   None       Assessment/Plan Fall Multiple B rib fxs R 3-6 and L 3-5 - pain control and pulmonary toilet. Repeat CXR pending R great toe pain - xray neg for fx HTN - home meds HLD - home meds Asthma - home albuterol PRN Depression - home prozac Hypothyroidism - home synthroid H/o CVA Abdominal wall hernia Thyroid nodule  ID - none FEN - CM diet VTE - SCDs, lovenox Foley - none  Plan - CXR pending. Wean to room air. OT consult pending.  Will recheck later today for possible discharge.   LOS: 0 days    Veronica Simpson , [facility] 07/09/2018, 7:57 AM Pager: [phone] Mon 7:00 am -11:30 AM Tues-Fri 7:00 am-4:30 pm Sat-Sun 7:00 am-11:30 am

## 2018-07-09 NOTE — Care Management Note (Signed)
Case Management Note  Patient Details  Name: Veronica Simpson MRN: [id number] Date of Birth: 10-14-1945  Subjective/Objective:   72 y.o. female admitted on 07/07/18 after falling in the parking lot at [facility] sustaining multiple bil rib fx R3-6 and L 3-5, R great toe pain (x- ray negative for fx).  PTA, pt independent, lives at home with spouse.                 Action/Plan: PT/OT recommending HH follow up, and pt agreeable to services. Referral to [facility], per pt choice.  Start of care 24-48h post dc date.  Referral to [facility] for RW, to be delivered to room prior to dc.  Expected Discharge Date:  07/09/18               Expected Discharge Plan:  [place]  In-House Referral:  Clinical Social Work  Discharge planning Services  CM Consult  Post Acute Care Choice:  Home Health Choice offered to:  Patient  DME Arranged:  [facility] D[facility] Agency:  [place] Arranged:  PT, OT [facility] Agency:  [facility]  Status of Service:  Completed, signed off  If discussed at [place] of Stay Meetings, dates discussed:    Additional Comments:  Reinaldo Raddle, RN, BSN  Trauma/Neuro ICU Case Manager [phone]

## 2018-07-09 NOTE — Discharge Summary (Signed)
[facility] Discharge Summary   Patient ID: Veronica Simpson MRN: [id number] DOB/AGE: 09/08/1945 72 y.o.  Admit date: 07/07/2018 Discharge date: 07/09/2018  Admitting Diagnosis: Fall Multiple B rib fxs R 3-6 and L 3-5  Discharge Diagnosis Patient Active Problem List   Diagnosis Date Noted  . Multiple rib fractures 07/08/2018  . Weight loss 05/04/2014  . Sinus bradycardia by electrocardiogram 09/08/2013  . Atrophic vaginitis 09/08/2013  . History of gastric stapling 09/08/2013  . 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 Osteoporosis 05/09/2013  . History of TIA (transient ischemic attack) 05/09/2013  . Urinary urgency 04/12/2013  . Vaginal dryness 04/12/2013  . HTN (hypertension) 04/01/2013  . Hyperlipidemia 04/01/2013  . Hypothyroidism 04/01/2013  . Dyspareunia 04/01/2013  . Depression 04/01/2013  . S/P total hysterectomy and bilateral salpingo-oophorectomy 04/01/2013  . Menopause 04/01/2013  . History of sleep apnea 04/01/2013  . Edema, lower extremity 04/01/2013  . Osteopenia     Consultants None  Imaging: Ct Head Wo Contrast  Result Date: 07/07/2018 CLINICAL DATA:  Frontal headache and bilateral left laceration after tripping and falling today. EXAM: CT HEAD WITHOUT CONTRAST CT MAXILLOFACIAL WITHOUT CONTRAST TECHNIQUE: Multidetector CT imaging of the head and maxillofacial structures were performed using the standard protocol without intravenous contrast. Multiplanar CT image reconstructions of the maxillofacial structures were also generated. COMPARISON:  Brain MR dated 04/25/2010 and head CT dated 04/25/2010. FINDINGS: CT HEAD FINDINGS Brain: Stable mildly enlarged ventricles and cortical sulci. Minimal patchy white matter low density in both cerebral hemispheres with mild progression. No intracranial hemorrhage, mass lesion or CT evidence of acute infarction. Vascular: No hyperdense vessel or unexpected calcification. Skull: Bilateral hyperostosis frontalis.  No skull fracture. Sinuses/Orbits: Unremarkable. Other: None. CT MAXILLOFACIAL FINDINGS Osseous: No fracture or mandibular dislocation. No destructive process. Orbits: Negative. No traumatic or inflammatory finding. Sinuses: Mucosal thickening in the inferior right maxillary sinus. Soft tissues: Unremarkable. IMPRESSION: 1. No skull fracture or intracranial hemorrhage. 2. No maxillofacial fracture. 3. Mildly progressive minimal chronic small vessel white matter ischemic changes in both cerebral hemispheres. 4. Stable mild diffuse cerebral and cerebellar atrophy. 5. Mild chronic right maxillary sinusitis. Electronically Signed   By: Claudie Revering M.D.   On: 07/07/2018 22:18   Ct Chest W Contrast  Result Date: 07/07/2018 CLINICAL DATA:  Chest pain with deep inspiration following a fall today. EXAM: CT CHEST, ABDOMEN, AND PELVIS WITH CONTRAST TECHNIQUE: Multidetector CT imaging of the chest, abdomen and pelvis was performed following the standard protocol during bolus administration of intravenous contrast. CONTRAST:  140mL ISOVUE-300 IOPAMIDOL (ISOVUE-300) INJECTION 61% COMPARISON:  Abdomen and pelvis CT dated 12/12/2007. Chest radiographs dated 10/29/2010. FINDINGS: CT CHEST FINDINGS Cardiovascular: Atheromatous calcifications, including the coronary arteries and aorta. Normal sized heart. Mediastinum/Nodes: No enlarged lymph nodes. Diffusely enlarged and heterogeneous thyroid gland containing multiple calcified and noncalcified nodules. The largest nodule is on the left and is heterogeneous with a low density component and high density component and measures 4.1 cm in maximum diameter on image number 9 series 2. Lungs/Pleura: Mild left and minimal right dependent atelectasis. Left lower lobe calcified granuloma. No airspace consolidation or pleural fluid. Musculoskeletal: Focal angulation deformities of the anterolateral aspects of the 3rd, 4th and 5th ribs bilaterally and right 6th rib. There are small cortical  step-offs involving the right 4th, 5th and 6th ribs. Thoracic spine degenerative changes and dextroconvex scoliosis. CT ABDOMEN PELVIS FINDINGS Hepatobiliary: Right lobe liver calcified granuloma. Cholecystectomy clips. Pancreas: Unremarkable. No pancreatic ductal dilatation or surrounding inflammatory changes. Spleen: Normal  in size without focal abnormality. Adrenals/Urinary Tract: No significant change in an oval right adrenal mass measuring 1.6 x 1.3 cm on image number 52 series 2. Normal appearing left adrenal gland. Stable rounded fat density mass in the lower pole of the left kidney, measuring 2.9 cm in maximum diameter. This continues to have a minimal curvilinear soft tissue density component. Interval 1.8 cm similar appearing fat density mass in the mid right kidney. Unremarkable urinary bladder and ureters. Stomach/Bowel: Post gastric bypass changes. Small bowel loops in a right lower anterolateral wide-mouth abdominal wall hernia without dilatation or wall thickening. Unremarkable colon. Surgically absent appendix. Vascular/Lymphatic: Mild atheromatous arterial calcifications. No enlarged lymph nodes. Reproductive: Status post hysterectomy. No adnexal masses. Other: Moderate-sized wide-mouth right anterolateral abdominal wall hernia containing herniated fat and small bowel loops. This is at the level of the upper pelvis. Musculoskeletal: Lumbar spine degenerative changes. No fractures, pars defects or subluxations. The pelvic bones are intact with a small left iliac bone island noted. IMPRESSION: 1. Essentially nondisplaced fractures of the anterolateral aspects of the 3rd, 4th and 5th ribs bilaterally and right 6th rib. 2. Stable right adrenal adenoma. 3. Stable left renal angiomyolipoma and interval small right renal angiomyolipoma. 4. Multinodular thyroid goiter with a dominant nodule on the left measuring 4.1 cm in maximum diameter. Consider further evaluation with thyroid ultrasound. If patient is  clinically hyperthyroid, consider nuclear medicine thyroid uptake and scan. 5. Moderate-sized wide-mouth right anterolateral abdominal wall hernia containing herniated fat and small bowel loops without obstruction. 6.  Calcific coronary artery and aortic atherosclerosis. Aortic Atherosclerosis (ICD10-I70.0). Electronically Signed   By: Claudie Revering M.D.   On: 07/07/2018 22:43   Ct Abdomen Pelvis W Contrast  Result Date: 07/07/2018 CLINICAL DATA:  Chest pain with deep inspiration following a fall today. EXAM: CT CHEST, ABDOMEN, AND PELVIS WITH CONTRAST TECHNIQUE: Multidetector CT imaging of the chest, abdomen and pelvis was performed following the standard protocol during bolus administration of intravenous contrast. CONTRAST:  160mL ISOVUE-300 IOPAMIDOL (ISOVUE-300) INJECTION 61% COMPARISON:  Abdomen and pelvis CT dated 12/12/2007. Chest radiographs dated 10/29/2010. FINDINGS: CT CHEST FINDINGS Cardiovascular: Atheromatous calcifications, including the coronary arteries and aorta. Normal sized heart. Mediastinum/Nodes: No enlarged lymph nodes. Diffusely enlarged and heterogeneous thyroid gland containing multiple calcified and noncalcified nodules. The largest nodule is on the left and is heterogeneous with a low density component and high density component and measures 4.1 cm in maximum diameter on image number 9 series 2. Lungs/Pleura: Mild left and minimal right dependent atelectasis. Left lower lobe calcified granuloma. No airspace consolidation or pleural fluid. Musculoskeletal: Focal angulation deformities of the anterolateral aspects of the 3rd, 4th and 5th ribs bilaterally and right 6th rib. There are small cortical step-offs involving the right 4th, 5th and 6th ribs. Thoracic spine degenerative changes and dextroconvex scoliosis. CT ABDOMEN PELVIS FINDINGS Hepatobiliary: Right lobe liver calcified granuloma. Cholecystectomy clips. Pancreas: Unremarkable. No pancreatic ductal dilatation or surrounding  inflammatory changes. Spleen: Normal in size without focal abnormality. Adrenals/Urinary Tract: No significant change in an oval right adrenal mass measuring 1.6 x 1.3 cm on image number 52 series 2. Normal appearing left adrenal gland. Stable rounded fat density mass in the lower pole of the left kidney, measuring 2.9 cm in maximum diameter. This continues to have a minimal curvilinear soft tissue density component. Interval 1.8 cm similar appearing fat density mass in the mid right kidney. Unremarkable urinary bladder and ureters. Stomach/Bowel: Post gastric bypass changes. Small bowel loops in a right lower  anterolateral wide-mouth abdominal wall hernia without dilatation or wall thickening. Unremarkable colon. Surgically absent appendix. Vascular/Lymphatic: Mild atheromatous arterial calcifications. No enlarged lymph nodes. Reproductive: Status post hysterectomy. No adnexal masses. Other: Moderate-sized wide-mouth right anterolateral abdominal wall hernia containing herniated fat and small bowel loops. This is at the level of the upper pelvis. Musculoskeletal: Lumbar spine degenerative changes. No fractures, pars defects or subluxations. The pelvic bones are intact with a small left iliac bone island noted. IMPRESSION: 1. Essentially nondisplaced fractures of the anterolateral aspects of the 3rd, 4th and 5th ribs bilaterally and right 6th rib. 2. Stable right adrenal adenoma. 3. Stable left renal angiomyolipoma and interval small right renal angiomyolipoma. 4. Multinodular thyroid goiter with a dominant nodule on the left measuring 4.1 cm in maximum diameter. Consider further evaluation with thyroid ultrasound. If patient is clinically hyperthyroid, consider nuclear medicine thyroid uptake and scan. 5. Moderate-sized wide-mouth right anterolateral abdominal wall hernia containing herniated fat and small bowel loops without obstruction. 6.  Calcific coronary artery and aortic atherosclerosis. Aortic  Atherosclerosis (ICD10-I70.0). Electronically Signed   By: Claudie Revering M.D.   On: 07/07/2018 22:43   Dg Chest Port 1 View  Result Date: 07/09/2018 CLINICAL DATA:  Multiple rib fractures EXAM: PORTABLE CHEST 1 VIEW COMPARISON:  07/07/2018 FINDINGS: Cardiac shadow is enlarged. The lungs are well aerated bilaterally. The known bilateral rib fractures are not well appreciated on today's exam. No new focal abnormality is noted. IMPRESSION: No acute abnormality seen. Known rib fractures are not well appreciated on today's exam. Electronically Signed   By: Inez Catalina M.D.   On: 07/09/2018 08:39   Dg Toe Great Right  Result Date: 07/08/2018 CLINICAL DATA:  Fall. Great toe pain and bruising. Initial encounter. EXAM: RIGHT GREAT TOE COMPARISON:  None. FINDINGS: No acute fracture or dislocation is identified. There is minimal spurring at the first MTP joint. No significant soft tissue abnormality is seen. IMPRESSION: No acute osseous abnormality. Electronically Signed   By: Logan Bores M.D.   On: 07/08/2018 12:23   Ct Maxillofacial Wo Contrast  Result Date: 07/07/2018 CLINICAL DATA:  Frontal headache and bilateral left laceration after tripping and falling today. EXAM: CT HEAD WITHOUT CONTRAST CT MAXILLOFACIAL WITHOUT CONTRAST TECHNIQUE: Multidetector CT imaging of the head and maxillofacial structures were performed using the standard protocol without intravenous contrast. Multiplanar CT image reconstructions of the maxillofacial structures were also generated. COMPARISON:  Brain MR dated 04/25/2010 and head CT dated 04/25/2010. FINDINGS: CT HEAD FINDINGS Brain: Stable mildly enlarged ventricles and cortical sulci. Minimal patchy white matter low density in both cerebral hemispheres with mild progression. No intracranial hemorrhage, mass lesion or CT evidence of acute infarction. Vascular: No hyperdense vessel or unexpected calcification. Skull: Bilateral hyperostosis frontalis. No skull fracture.  Sinuses/Orbits: Unremarkable. Other: None. CT MAXILLOFACIAL FINDINGS Osseous: No fracture or mandibular dislocation. No destructive process. Orbits: Negative. No traumatic or inflammatory finding. Sinuses: Mucosal thickening in the inferior right maxillary sinus. Soft tissues: Unremarkable. IMPRESSION: 1. No skull fracture or intracranial hemorrhage. 2. No maxillofacial fracture. 3. Mildly progressive minimal chronic small vessel white matter ischemic changes in both cerebral hemispheres. 4. Stable mild diffuse cerebral and cerebellar atrophy. 5. Mild chronic right maxillary sinusitis. Electronically Signed   By: Claudie Revering M.D.   On: 07/07/2018 22:18    Procedures None  Hospital Course:  Veronica Simpson is a 72yo female who was transferred to [facility] 12/10 after stubbing her toe and suffering a ground level fall.  She hit her face  and nose and bit her lip. When she tried to get up she had a large amount of pain in her chest. Workup showed multiple bilateral rib fractures (3-6 on the right and 3-5 on the left). Patient was admitted to the trauma service for pain control and pulmonary toilet. Chest xray the following morning stable without pneumothorax. Patient worked with therapies during this admission who recommended home health therapies when medically stable for discharge. On 12/12, the patient was voiding well, tolerating diet, ambulating well, pain well controlled, vital signs stable and felt stable for discharge home.  Patient will follow up as below and knows to call with questions or concerns.    I have personally reviewed the patients medication history on the [place] controlled substance database.     Allergies as of 07/09/2018   No Known Allergies     Medication List    STOP taking these medications   lisinopril-hydrochlorothiazide 20-25 MG tablet Commonly known as:  PRINZIDE,ZESTORETIC     TAKE these medications   acetaminophen 325 MG tablet Commonly known as:  TYLENOL Take 2  tablets (650 mg total) by mouth every 6 (six) hours as needed for mild pain.   albuterol 108 (90 Base) MCG/ACT inhaler Commonly known as:  PROVENTIL HFA;VENTOLIN HFA Inhale 1-2 puffs into the lungs every 6 (six) hours as needed for wheezing or shortness of breath.   aspirin 325 MG tablet Take 325 mg by mouth daily.   atenolol 50 MG tablet Commonly known as:  TENORMIN TAKE ONE TABLET BY MOUTH ONCE DAILY   atorvastatin 40 MG tablet Commonly known as:  LIPITOR TAKE ONE TABLET BY MOUTH DAILY What changed:  when to take this   co-enzyme Q-10 30 MG capsule Take 30 mg by mouth daily.   diclofenac sodium 1 % Gel Commonly known as:  VOLTAREN Apply 2 g topically daily as needed (pain).   docusate sodium 100 MG capsule Commonly known as:  COLACE Take 1 capsule (100 mg total) by mouth 2 (two) times daily.   FLUoxetine 40 MG capsule Commonly known as:  PROZAC TAKE 1 CAPSULE (40 MG TOTAL) BY MOUTH DAILY. What changed:    how much to take  how to take this  when to take this  additional instructions   hydrochlorothiazide 25 MG tablet Commonly known as:  HYDRODIURIL Take 25 mg by mouth daily.   ibuprofen 800 MG tablet Commonly known as:  ADVIL,MOTRIN Take one every 8 hours with food What changed:    how much to take  how to take this  when to take this  reasons to take this  additional instructions   levothyroxine 88 MCG tablet Commonly known as:  SYNTHROID, LEVOTHROID TAKE ONE TABLET BY MOUTH ONCE DAILY BEFORE  BREAKFAST What changed:    how much to take  how to take this  when to take this  additional instructions   lisinopril 40 MG tablet Commonly known as:  PRINIVIL,ZESTRIL Take 1 tablet (40 mg total) by mouth daily.   methocarbamol 500 MG tablet Commonly known as:  ROBAXIN Take 1 tablet (500 mg total) by mouth every 8 (eight) hours as needed for muscle spasms.   multivitamin with minerals tablet Take 1 tablet by mouth daily.   oxyCODONE 5 MG  immediate release tablet Commonly known as:  Oxy IR/ROXICODONE Take 1 tablet (5 mg total) by mouth every 6 (six) hours as needed for severe pain.   potassium chloride SA 20 MEQ tablet Commonly known as:  K-DUR,KLOR-CON  Take 1 tablet (20 mEq total) by mouth daily. What changed:  when to take this   vitamin C 500 MG tablet Commonly known as:  ASCORBIC ACID Take 500 mg by mouth daily.   Vitamin D-3 25 MCG (1000 UT) Caps Take 1 capsule by mouth daily.            Durable Medical Equipment  (From admission, onward)         Start     Ordered   07/09/18 0805  For home use only DME Walker rolling  Once    Question:  Patient needs a walker to treat with the following condition  Answer:  Multiple fractures of ribs, bilateral, initial encounter for closed fracture   07/09/18 0805           Follow-up Information    Podraza, Sindy Messing, PA-C. Call.   Specialty:  Physician Assistant Why:  Call to arrange post-hospitalization follow up appointment, and for follow up /management of rib fractures. discuss thyroid nodule seen on CT scan          Signed: Wellington Hampshire, [facility] 07/09/2018, 2:51 PM Pager: [phone] Mon 7:00 am -11:30 AM Tues-Fri 7:00 am-4:30 pm Sat-Sun 7:00 am-11:30 am

## 2019-09-11 ENCOUNTER — Ambulatory Visit: Payer: Medicare Other

## 2020-02-01 IMAGING — CT CT HEAD W/O CM
3 of 7 series · 15 of 47 positions shown, 18 images · non-contrast
Comparison: Brain MR dated 04/25/2010 and head CT dated 04/25/2010.

CLINICAL DATA: Frontal headache and bilateral left laceration after
tripping and falling today.

EXAM:
CT HEAD WITHOUT CONTRAST
CT MAXILLOFACIAL WITHOUT CONTRAST
TECHNIQUE: Multidetector CT imaging of the head and maxillofacial structures
were performed using the standard protocol without intravenous
contrast. Multiplanar CT image reconstructions of the maxillofacial
structures were also generated.

[Series 4: cor head wo · coronal · 0.33mm/px · 3 of 84 slices shown]
[im 22/84  brain]
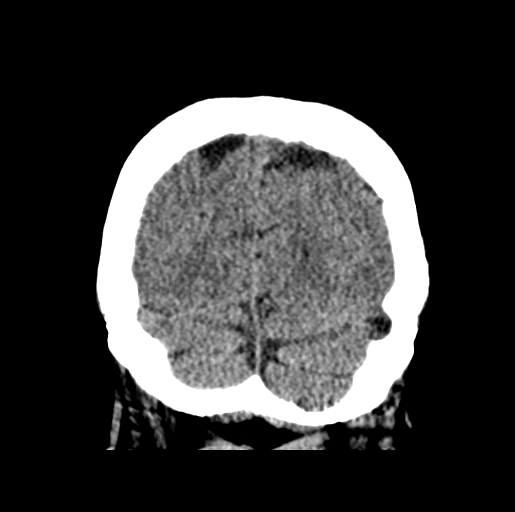
[im 39/84  brain]
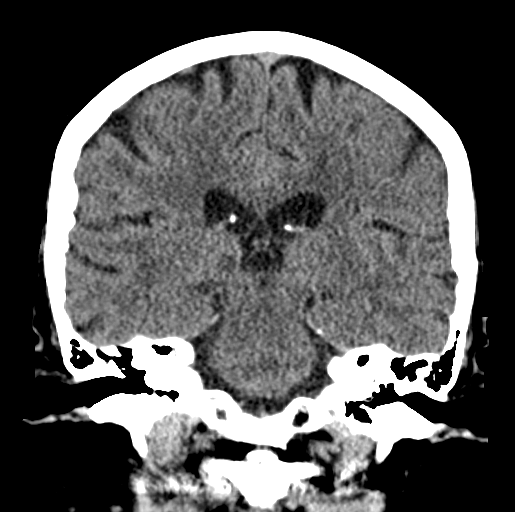
[im 55/84  brain]
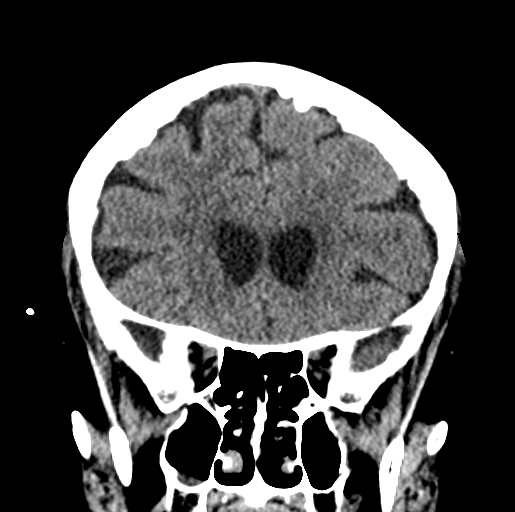

[Series 6: max soft · axial · 0.42mm/px · z∈[+1026,+1164]mm · 9 of 83 slices shown, 12 images]
[im 7/83  brain]
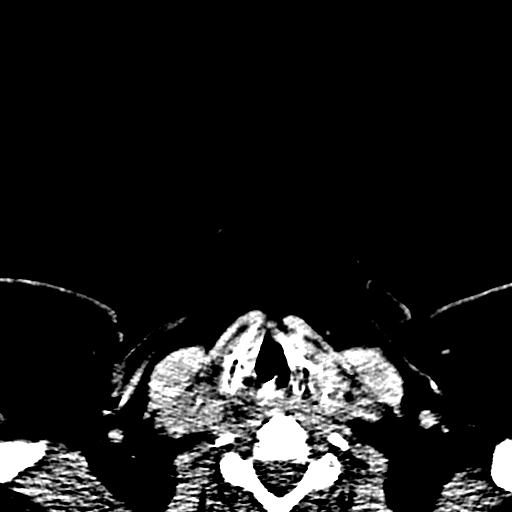
[im 7/83  bone]
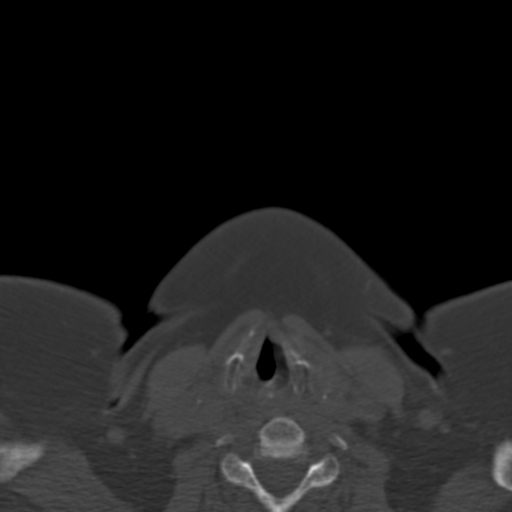
[im 14/83  brain]
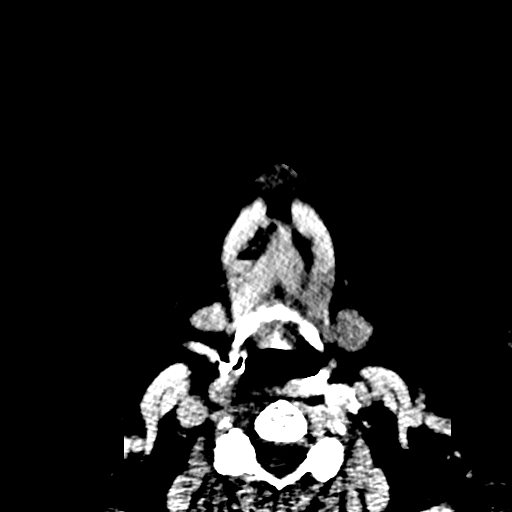
[im 28/83  brain]
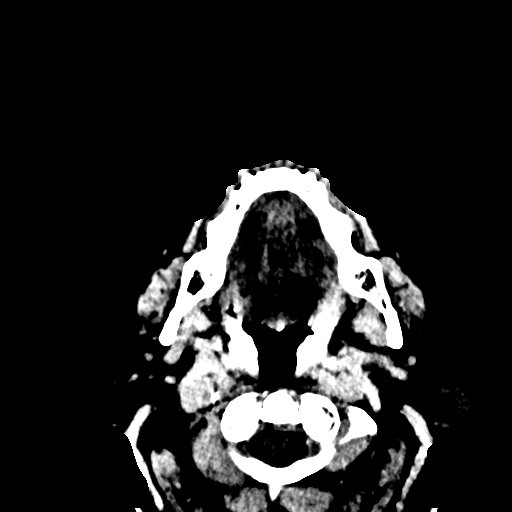
[im 35/83  brain]
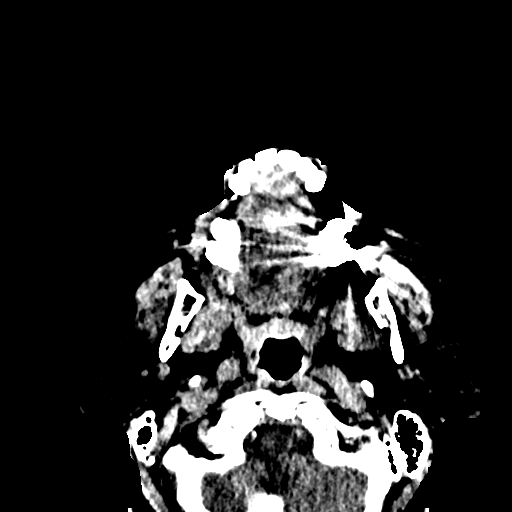
[im 42/83  brain]
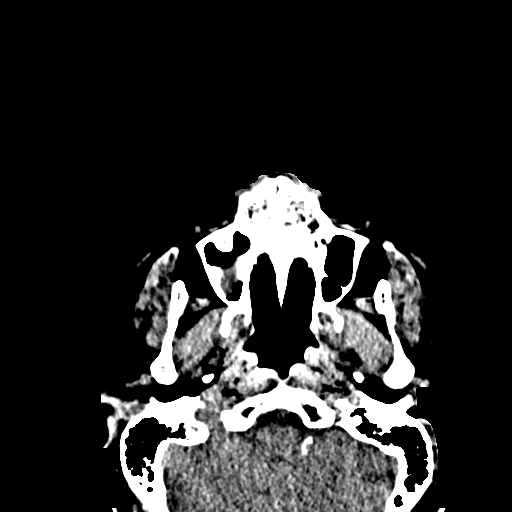
[im 42/83  bone]
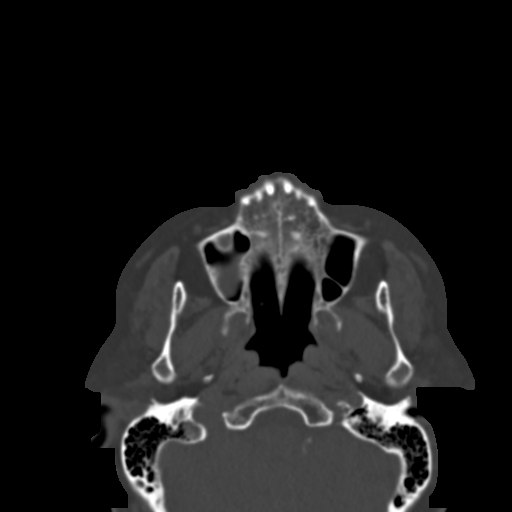
[im 48/83  brain]
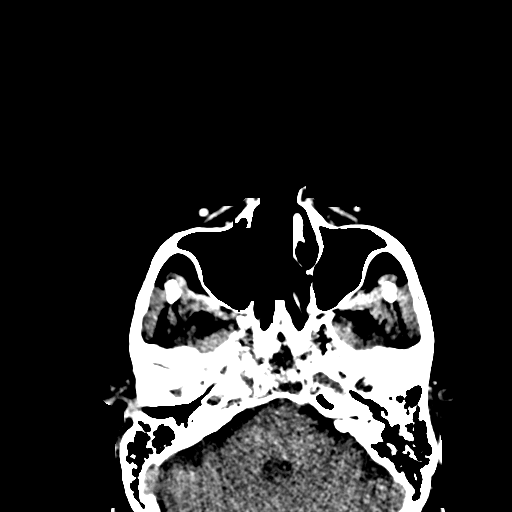
[im 55/83  brain]
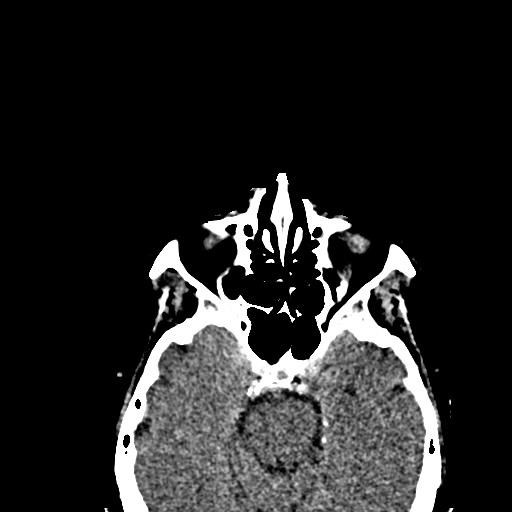
[im 69/83  brain]
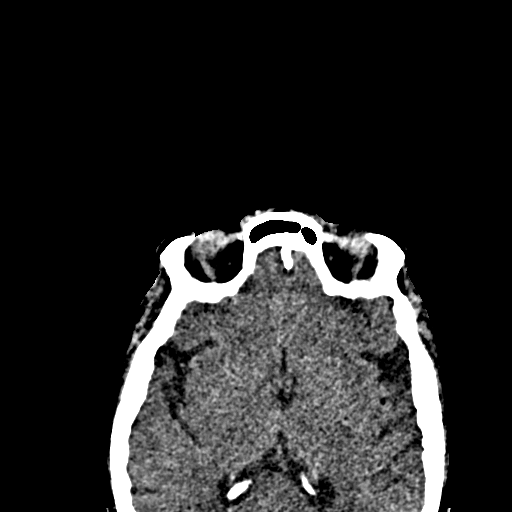
[im 76/83  brain]
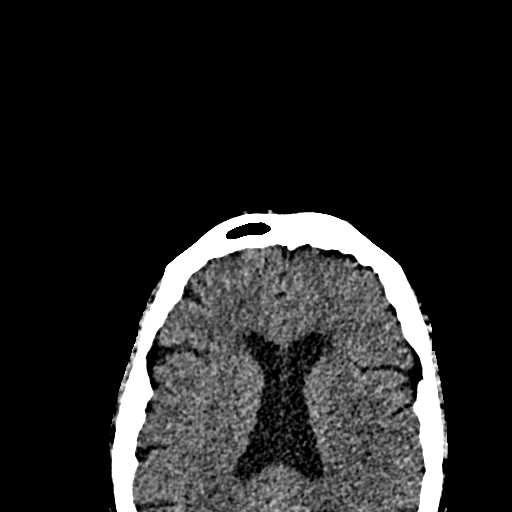
[im 76/83  bone]
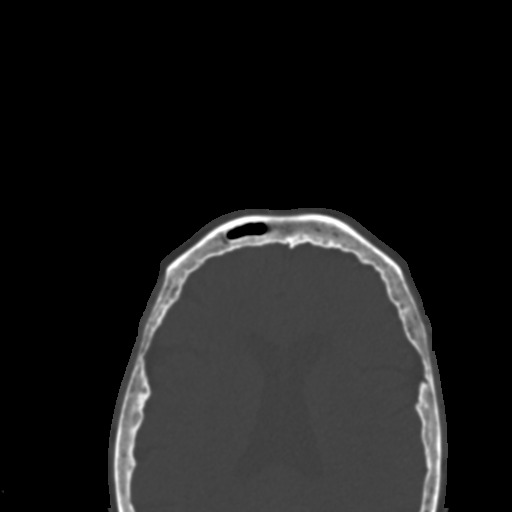

[Series 11: sagittal soft · sagittal · 0.32mm/px · 3 of 109 slices shown]
[im 28/109  brain]
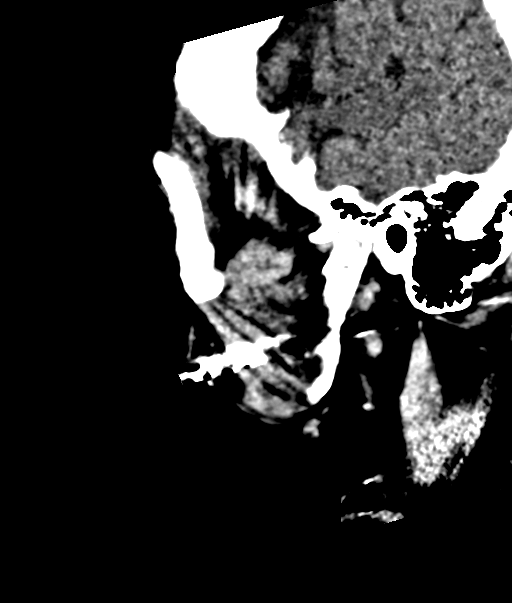
[im 55/109  brain]
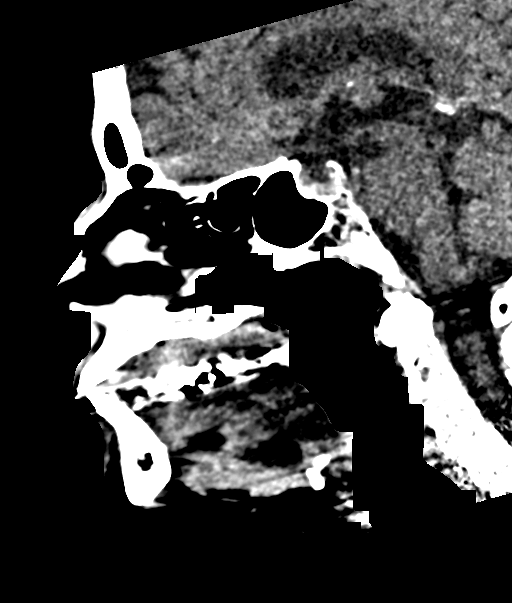
[im 82/109  brain]
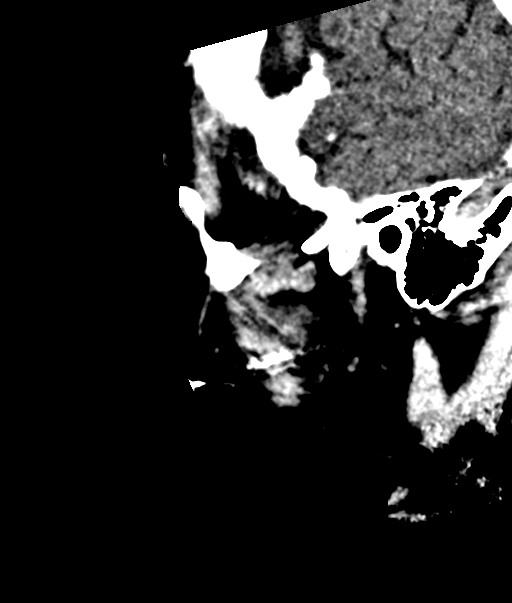

[15 of 47 positions shown; findings below may reference images not displayed]

FINDINGS: CT HEAD FINDINGS

Brain: Stable mildly enlarged ventricles and cortical sulci. Minimal
patchy white matter low density in both cerebral hemispheres with
mild progression. No intracranial hemorrhage, mass lesion or CT
evidence of acute infarction.

Vascular: No hyperdense vessel or unexpected calcification.

Skull: Bilateral hyperostosis frontalis. No skull fracture.

Sinuses/Orbits: Unremarkable.

Other: None.

CT MAXILLOFACIAL FINDINGS

Osseous: No fracture or mandibular dislocation. No destructive
process.

Orbits: Negative. No traumatic or inflammatory finding.

Sinuses: Mucosal thickening in the inferior right maxillary sinus.

Soft tissues: Unremarkable.
IMPRESSION: 1. No skull fracture or intracranial hemorrhage.
2. No maxillofacial fracture.
3. Mildly progressive minimal chronic small vessel white matter
ischemic changes in both cerebral hemispheres.
4. Stable mild diffuse cerebral and cerebellar atrophy.
5. Mild chronic right maxillary sinusitis.

## 2020-02-01 IMAGING — CT CT ABD-PELV W/ CM
3 of 5 series · 14 of 36 positions shown, 17 images · IV contrast (APPLIED)
Comparison: Abdomen and pelvis CT dated 12/12/2007. Chest
radiographs dated 10/29/2010.

CLINICAL DATA: Chest pain with deep inspiration following a fall
today.

EXAM:
CT CHEST, ABDOMEN, AND PELVIS WITH CONTRAST
TECHNIQUE: Multidetector CT imaging of the chest, abdomen and pelvis was
performed following the standard protocol during bolus
administration of intravenous contrast.
CONTRAST:  100mL 40LC1S-2XX IOPAMIDOL (40LC1S-2XX) INJECTION 61%

[Series 2: cap with 2 · axial · 0.83mm/px · z∈[+470,+946]mm · 9 of 119 slices shown, 12 images]
[im 12/119  mediastinal]
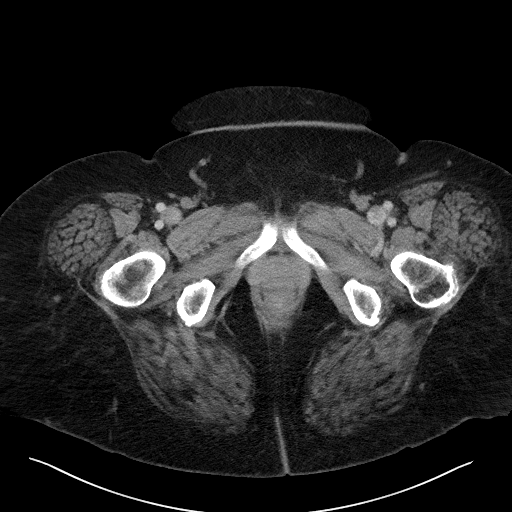
[im 12/119  lung]
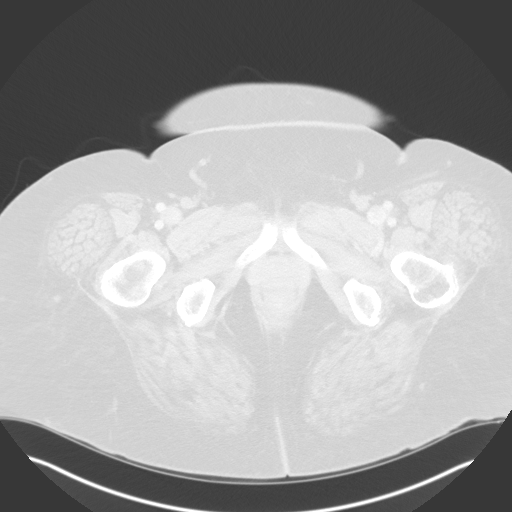
[im 24/119  lung]
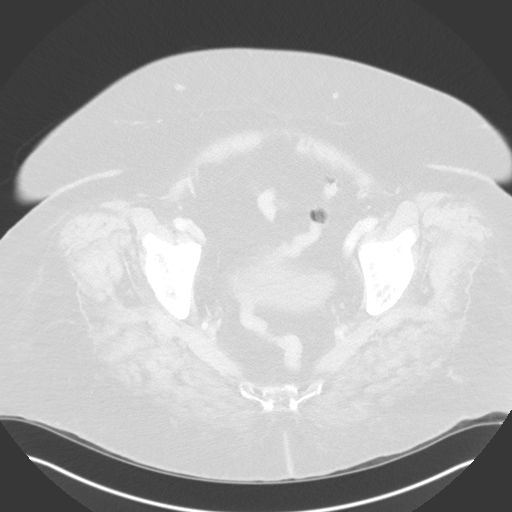
[im 36/119  lung]
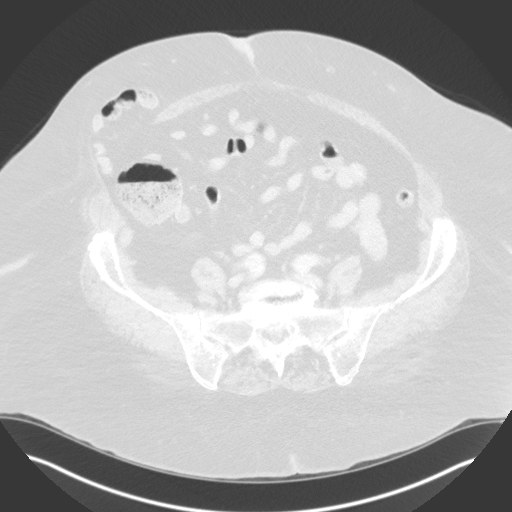
[im 48/119  lung]
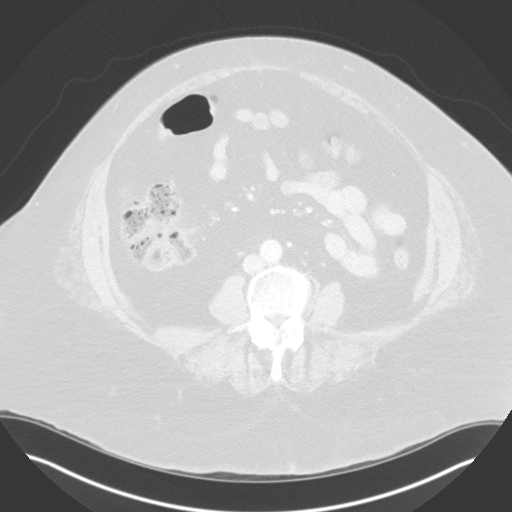
[im 60/119  mediastinal]
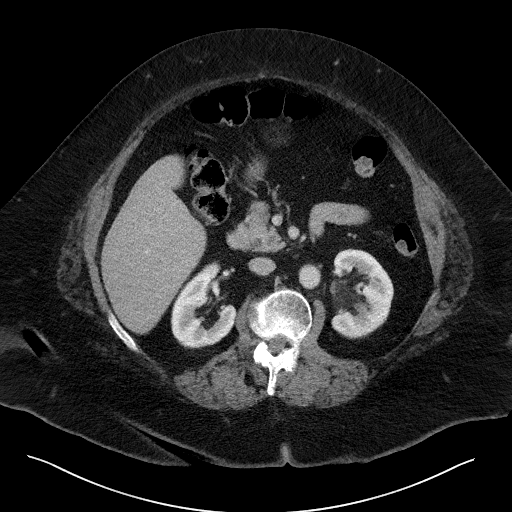
[im 60/119  lung]
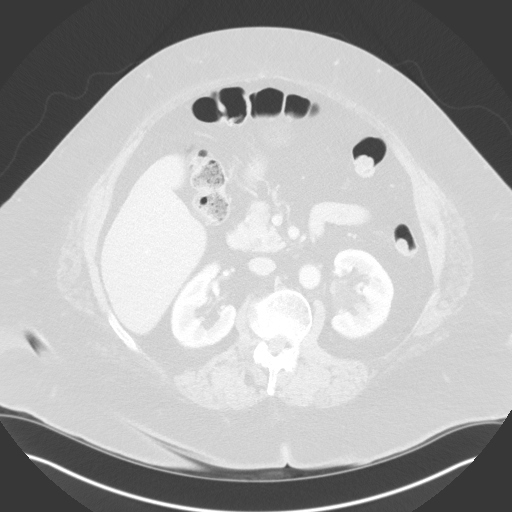
[im 71/119  lung]
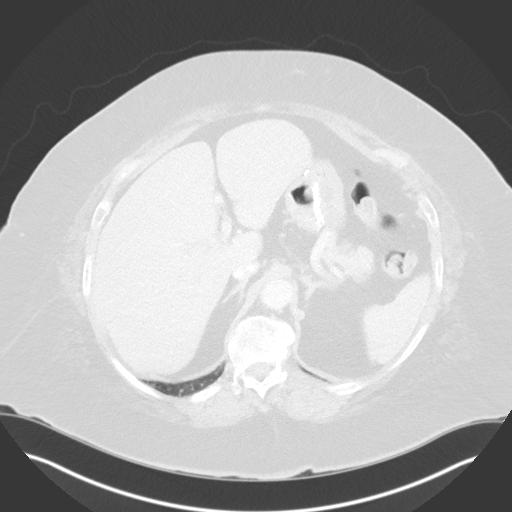
[im 83/119  lung]
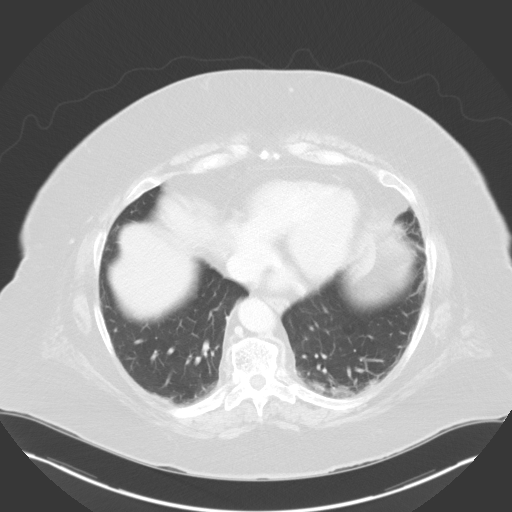
[im 95/119  lung]
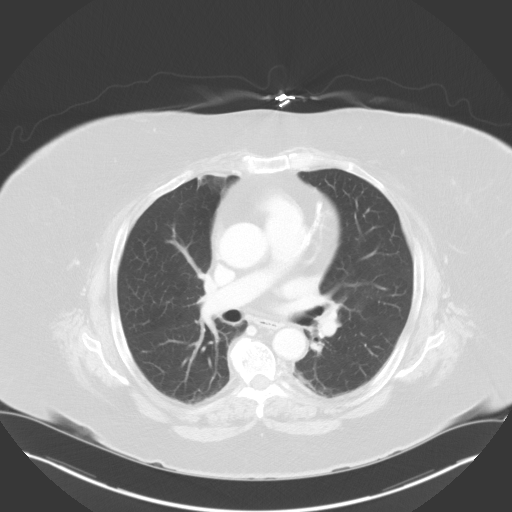
[im 107/119  mediastinal]
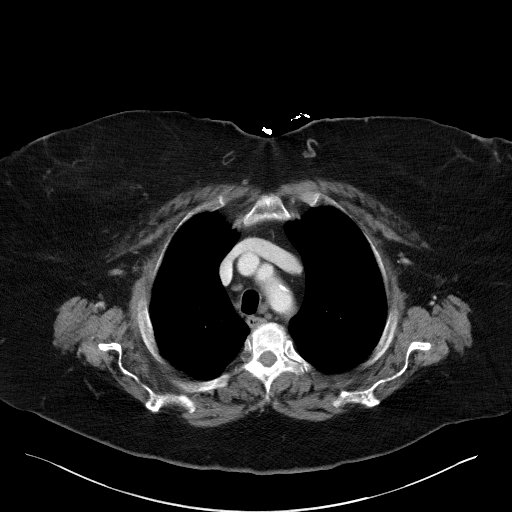
[im 107/119  lung]
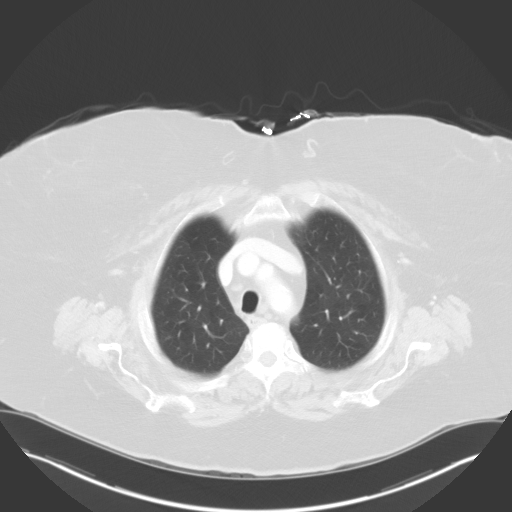

[Series 4: lung · axial · 0.83mm/px · z∈[+746,+794]mm · 2 of 142 slices shown]
[im 12/142  lung]
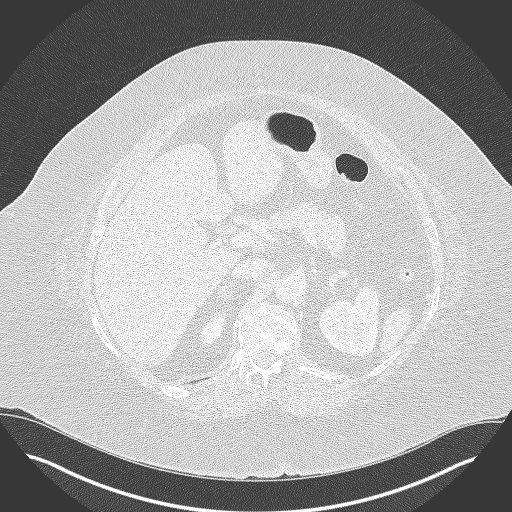
[im 36/142  lung]
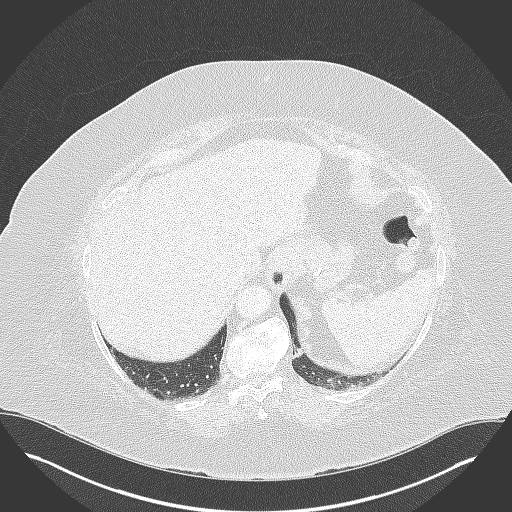

[Series 5: coronals · coronal · 0.97mm/px · 3 of 176 slices shown]
[im 36/176  lung]
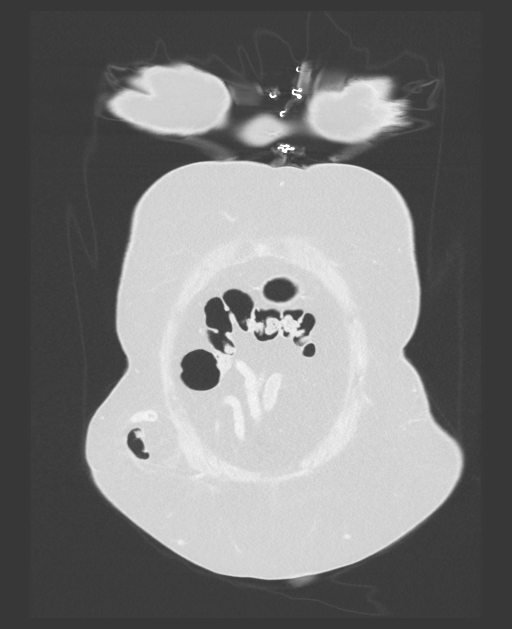
[im 71/176  lung]
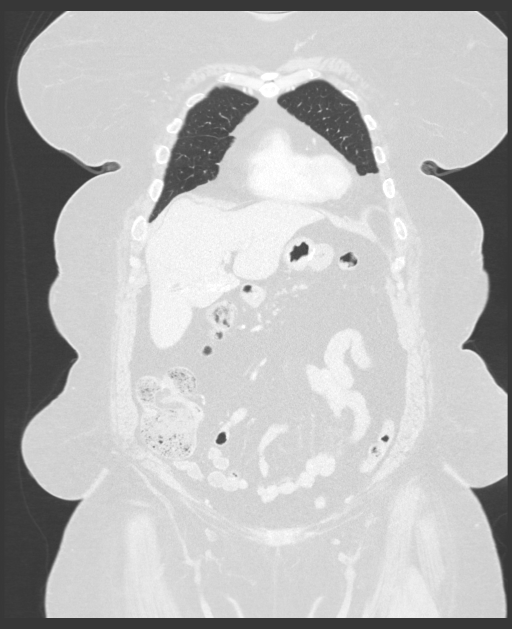
[im 106/176  lung]
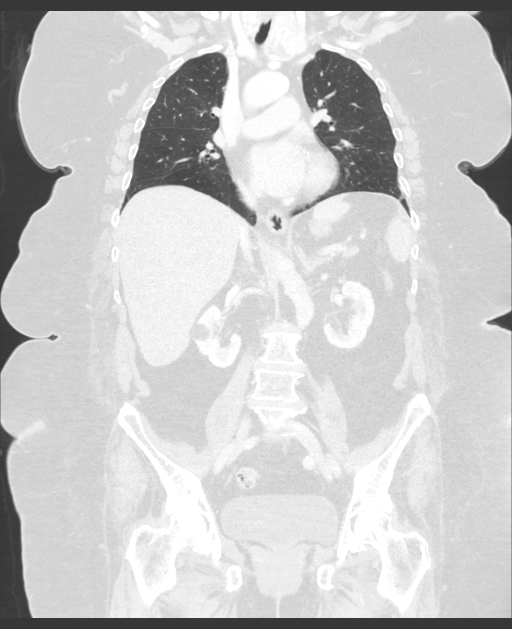

[14 of 36 positions shown; findings below may reference images not displayed]

FINDINGS: CT CHEST FINDINGS

Cardiovascular: Atheromatous calcifications, including the coronary
arteries and aorta. Normal sized heart.

Mediastinum/Nodes: No enlarged lymph nodes. Diffusely enlarged and
heterogeneous thyroid gland containing multiple calcified and
noncalcified nodules. The largest nodule is on the left and is
heterogeneous with a low density component and high density
component and measures 4.1 cm in maximum diameter on image number 9
series 2.

Lungs/Pleura: Mild left and minimal right dependent atelectasis.
Left lower lobe calcified granuloma. No airspace consolidation or
pleural fluid.

Musculoskeletal: Focal angulation deformities of the anterolateral
aspects of the 3rd, 4th and 5th ribs bilaterally and right 6th rib.
There are small cortical step-offs involving the right 4th, 5th and
6th ribs. Thoracic spine degenerative changes and dextroconvex
scoliosis.

CT ABDOMEN PELVIS FINDINGS

Hepatobiliary: Right lobe liver calcified granuloma. Cholecystectomy
clips.

Pancreas: Unremarkable. No pancreatic ductal dilatation or
surrounding inflammatory changes.

Spleen: Normal in size without focal abnormality.

Adrenals/Urinary Tract: No significant change in an oval right
adrenal mass measuring 1.6 x 1.3 cm on image number 52 series 2.
Normal appearing left adrenal gland.

Stable rounded fat density mass in the lower pole of the left
kidney, measuring 2.9 cm in maximum diameter. This continues to have
a minimal curvilinear soft tissue density component.

Interval 1.8 cm similar appearing fat density mass in the mid right
kidney.

Unremarkable urinary bladder and ureters.

Stomach/Bowel: Post gastric bypass changes. Small bowel loops in a
right lower anterolateral wide-mouth abdominal wall hernia without
dilatation or wall thickening. Unremarkable colon. Surgically absent
appendix.

Vascular/Lymphatic: Mild atheromatous arterial calcifications. No
enlarged lymph nodes.

Reproductive: Status post hysterectomy. No adnexal masses.

Other: Moderate-sized wide-mouth right anterolateral abdominal wall
hernia containing herniated fat and small bowel loops. This is at
the level of the upper pelvis.

Musculoskeletal: Lumbar spine degenerative changes. No fractures,
pars defects or subluxations. The pelvic bones are intact with a
small left iliac bone island noted.
IMPRESSION: 1. Essentially nondisplaced fractures of the anterolateral aspects
of the 3rd, 4th and 5th ribs bilaterally and right 6th rib.
2. Stable right adrenal adenoma.
3. Stable left renal angiomyolipoma and interval small right renal
angiomyolipoma.
4. Multinodular thyroid goiter with a dominant nodule on the left
measuring 4.1 cm in maximum diameter. Consider further evaluation
with thyroid ultrasound. If patient is clinically hyperthyroid,
consider nuclear medicine thyroid uptake and scan.
5. Moderate-sized wide-mouth right anterolateral abdominal wall
hernia containing herniated fat and small bowel loops without
obstruction.
6.  Calcific coronary artery and aortic atherosclerosis.

Aortic Atherosclerosis ([id number]-[id number].[id number]).
